# Patient Record
Sex: Male | Born: 1985 | Race: Black or African American | Hispanic: No | Marital: Single | Smoking: Never smoker
Health system: Southern US, Community
[De-identification: ages and names within clinical notes are randomized; demographics above are authoritative.]

---
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
## 2005-09-20 ENCOUNTER — Emergency Department (HOSPITAL_COMMUNITY): Admission: EM | Admit: 2005-09-20 | Discharge: 2005-09-20 | Payer: Self-pay | Admitting: Emergency Medicine

## 2005-11-12 ENCOUNTER — Encounter: Payer: Self-pay | Admitting: Emergency Medicine

## 2020-02-12 ENCOUNTER — Emergency Department (HOSPITAL_COMMUNITY): Payer: BLUE CROSS/BLUE SHIELD

## 2020-02-12 ENCOUNTER — Emergency Department (HOSPITAL_COMMUNITY): Payer: BLUE CROSS/BLUE SHIELD | Admitting: Anesthesiology

## 2020-02-12 ENCOUNTER — Encounter (HOSPITAL_COMMUNITY): Admission: EM | Disposition: A | Discharge status: Home Health | Payer: Self-pay | Source: Home / Self Care

## 2020-02-12 ENCOUNTER — Inpatient Hospital Stay (HOSPITAL_COMMUNITY)
Admission: EM | Admit: 2020-02-12 | Discharge: 2020-02-28 | DRG: 958 | Disposition: A | Discharge status: Home Health | Payer: BLUE CROSS/BLUE SHIELD | Attending: Physician Assistant | Admitting: Physician Assistant

## 2020-02-12 ENCOUNTER — Inpatient Hospital Stay (HOSPITAL_COMMUNITY): Payer: BLUE CROSS/BLUE SHIELD

## 2020-02-12 DIAGNOSIS — S31633A Puncture wound without foreign body of abdominal wall, right lower quadrant with penetration into peritoneal cavity, initial encounter: Secondary | ICD-10-CM | POA: Diagnosis present

## 2020-02-12 DIAGNOSIS — S31109A Unspecified open wound of abdominal wall, unspecified quadrant without penetration into peritoneal cavity, initial encounter: Secondary | ICD-10-CM

## 2020-02-12 DIAGNOSIS — Z6838 Body mass index (BMI) 38.0-38.9, adult: Secondary | ICD-10-CM

## 2020-02-12 DIAGNOSIS — Z20822 Contact with and (suspected) exposure to covid-19: Secondary | ICD-10-CM | POA: Diagnosis present

## 2020-02-12 DIAGNOSIS — S36598A Other injury of other part of colon, initial encounter: Secondary | ICD-10-CM | POA: Diagnosis present

## 2020-02-12 DIAGNOSIS — W3400XA Accidental discharge from unspecified firearms or gun, initial encounter: Secondary | ICD-10-CM

## 2020-02-12 DIAGNOSIS — S32301A Unspecified fracture of right ilium, initial encounter for closed fracture: Secondary | ICD-10-CM | POA: Diagnosis present

## 2020-02-12 DIAGNOSIS — N179 Acute kidney failure, unspecified: Secondary | ICD-10-CM | POA: Diagnosis not present

## 2020-02-12 DIAGNOSIS — K9189 Other postprocedural complications and disorders of digestive system: Secondary | ICD-10-CM

## 2020-02-12 DIAGNOSIS — T1490XA Injury, unspecified, initial encounter: Secondary | ICD-10-CM | POA: Diagnosis present

## 2020-02-12 DIAGNOSIS — K567 Ileus, unspecified: Secondary | ICD-10-CM | POA: Diagnosis not present

## 2020-02-12 DIAGNOSIS — S36030A Superficial (capsular) laceration of spleen, initial encounter: Secondary | ICD-10-CM | POA: Diagnosis present

## 2020-02-12 DIAGNOSIS — E669 Obesity, unspecified: Secondary | ICD-10-CM | POA: Diagnosis present

## 2020-02-12 DIAGNOSIS — Z4659 Encounter for fitting and adjustment of other gastrointestinal appliance and device: Secondary | ICD-10-CM

## 2020-02-12 HISTORY — PX: LAPAROTOMY: SHX154

## 2020-02-12 HISTORY — PX: OTHER SURGICAL HISTORY: SHX169

## 2020-02-12 LAB — TYPE AND SCREEN
ABO/RH(D): A POS
Antibody Screen: NEGATIVE
Unit division: 0
Unit division: 0
Unit division: 0
Unit division: 0
Unit division: 0
Unit division: 0
Unit division: 0
Unit division: 0
Unit division: 0
Unit division: 0
Unit division: 0
Unit division: 0
Unit division: 0
Unit division: 0
Unit division: 0
Unit division: 0

## 2020-02-12 LAB — COMPREHENSIVE METABOLIC PANEL
ALT: 32 U/L (ref 0–44)
AST: 43 U/L — ABNORMAL HIGH (ref 15–41)
Albumin: 3.8 g/dL (ref 3.5–5.0)
Alkaline Phosphatase: 50 U/L (ref 38–126)
Anion gap: 9 (ref 5–15)
BUN: 18 mg/dL (ref 6–20)
CO2: 21 mmol/L — ABNORMAL LOW (ref 22–32)
Calcium: 7.9 mg/dL — ABNORMAL LOW (ref 8.9–10.3)
Chloride: 108 mmol/L (ref 98–111)
Creatinine, Ser: 1.56 mg/dL — ABNORMAL HIGH (ref 0.61–1.24)
GFR calc Af Amer: >60 mL/min (ref >60)
GFR calc non Af Amer: 57 mL/min — ABNORMAL LOW (ref >60)
Glucose, Bld: 149 mg/dL — ABNORMAL HIGH (ref 70–99)
Potassium: 4.2 mmol/L (ref 3.5–5.1)
Sodium: 138 mmol/L (ref 135–145)
Total Bilirubin: 0.6 mg/dL (ref 0.3–1.2)
Total Protein: 6.2 g/dL — ABNORMAL LOW (ref 6.5–8.1)

## 2020-02-12 LAB — BPAM FFP
Blood Product Expiration Date: 202108042359
Blood Product Expiration Date: 202108052359
Blood Product Expiration Date: 202108052359
Blood Product Expiration Date: 202108052359
Blood Product Expiration Date: 202108052359
Blood Product Expiration Date: 202108062359
Blood Product Expiration Date: 202108062359
Blood Product Expiration Date: 202108062359
ISSUE DATE / TIME: 202108011545
ISSUE DATE / TIME: 202108011545
ISSUE DATE / TIME: 202108011545
ISSUE DATE / TIME: 202108011545
ISSUE DATE / TIME: 202108011549
ISSUE DATE / TIME: 202108011549
ISSUE DATE / TIME: 202108011551
ISSUE DATE / TIME: 202108011607
Unit Type and Rh: 6200
Unit Type and Rh: 6200
Unit Type and Rh: 6200
Unit Type and Rh: 6200
Unit Type and Rh: 6200
Unit Type and Rh: 6200
Unit Type and Rh: 6200
Unit Type and Rh: 6200

## 2020-02-12 LAB — BPAM RBC
Blood Product Expiration Date: 202108082359
Blood Product Expiration Date: 202108292359
Blood Product Expiration Date: 202108292359
Blood Product Expiration Date: 202108292359
Blood Product Expiration Date: 202108292359
Blood Product Expiration Date: 202108312359
Blood Product Expiration Date: 202108312359
Blood Product Expiration Date: 202108312359
Blood Product Expiration Date: 202108312359
Blood Product Expiration Date: 202108312359
Blood Product Expiration Date: 202108312359
Blood Product Expiration Date: 202108312359
Blood Product Expiration Date: 202108312359
Blood Product Expiration Date: 202108312359
Blood Product Expiration Date: 202108312359
Blood Product Expiration Date: 202108312359
ISSUE DATE / TIME: 202108011545
ISSUE DATE / TIME: 202108011545
ISSUE DATE / TIME: 202108011545
ISSUE DATE / TIME: 202108011545
ISSUE DATE / TIME: 202108011550
ISSUE DATE / TIME: 202108011550
ISSUE DATE / TIME: 202108011550
ISSUE DATE / TIME: 202108011550
ISSUE DATE / TIME: 202108011552
ISSUE DATE / TIME: 202108011552
ISSUE DATE / TIME: 202108011552
ISSUE DATE / TIME: 202108011552
ISSUE DATE / TIME: 202108011558
ISSUE DATE / TIME: 202108011558
ISSUE DATE / TIME: 202108011558
ISSUE DATE / TIME: 202108011558
Unit Type and Rh: 5100
Unit Type and Rh: 5100
Unit Type and Rh: 5100
Unit Type and Rh: 5100
Unit Type and Rh: 5100
Unit Type and Rh: 5100
Unit Type and Rh: 5100
Unit Type and Rh: 5100
Unit Type and Rh: 5100
Unit Type and Rh: 5100
Unit Type and Rh: 5100
Unit Type and Rh: 5100
Unit Type and Rh: 5100
Unit Type and Rh: 5100
Unit Type and Rh: 5100
Unit Type and Rh: 5100

## 2020-02-12 LAB — CBC
HCT: 42.9 % (ref 39.0–52.0)
Hemoglobin: 13.8 g/dL (ref 13.0–17.0)
MCH: 28 pg (ref 26.0–34.0)
MCHC: 32.2 g/dL (ref 30.0–36.0)
MCV: 87 fL (ref 80.0–100.0)
Platelets: 238 10*3/uL (ref 150–400)
RBC: 4.93 MIL/uL (ref 4.22–5.81)
RDW: 14.4 % (ref 11.5–15.5)
WBC: 9.3 10*3/uL (ref 4.0–10.5)
nRBC: 0 % (ref 0.0–0.2)

## 2020-02-12 LAB — PREPARE FRESH FROZEN PLASMA
Unit division: 0
Unit division: 0
Unit division: 0
Unit division: 0
Unit division: 0
Unit division: 0
Unit division: 0

## 2020-02-12 LAB — BPAM PLATELET PHERESIS
Blood Product Expiration Date: 202108032359
ISSUE DATE / TIME: 202108011554
Unit Type and Rh: 6200

## 2020-02-12 LAB — PREPARE PLATELET PHERESIS: Unit division: 0

## 2020-02-12 LAB — POCT I-STAT 7, (LYTES, BLD GAS, ICA,H+H)
Acid-base deficit: 3 mmol/L — ABNORMAL HIGH (ref 0.0–2.0)
Bicarbonate: 22.6 mmol/L (ref 20.0–28.0)
Calcium, Ion: 1.17 mmol/L (ref 1.15–1.40)
HCT: 39 % (ref 39.0–52.0)
Hemoglobin: 13.3 g/dL (ref 13.0–17.0)
O2 Saturation: 93 %
Potassium: 3.3 mmol/L — ABNORMAL LOW (ref 3.5–5.1)
Sodium: 143 mmol/L (ref 135–145)
TCO2: 24 mmol/L (ref 22–32)
pCO2 arterial: 42.8 mmHg (ref 32.0–48.0)
pH, Arterial: 7.331 — ABNORMAL LOW (ref 7.350–7.450)
pO2, Arterial: 72 mmHg — ABNORMAL LOW (ref 83.0–108.0)

## 2020-02-12 LAB — ABO/RH: ABO/RH(D): A POS

## 2020-02-12 LAB — TRAUMA TEG PANEL
CFF Max Amplitude: 17.2 mm (ref 15–32)
Citrated Kaolin (R): 4 min — ABNORMAL LOW (ref 4.6–9.1)
Citrated Rapid TEG (MA): 57.4 mm (ref 52–70)
Lysis at 30 Minutes: 0 % (ref 0.0–2.6)

## 2020-02-12 LAB — PROTIME-INR
INR: 1.1 (ref 0.8–1.2)
Prothrombin Time: 13.4 seconds (ref 11.4–15.2)

## 2020-02-12 LAB — ETHANOL: Alcohol, Ethyl (B): <10 mg/dL (ref <10)

## 2020-02-12 LAB — SARS CORONAVIRUS 2 BY RT PCR (HOSPITAL ORDER, PERFORMED IN ~~LOC~~ HOSPITAL LAB): SARS Coronavirus 2: NEGATIVE

## 2020-02-12 LAB — HIV ANTIBODY (ROUTINE TESTING W REFLEX): HIV Screen 4th Generation wRfx: NONREACTIVE

## 2020-02-12 LAB — LACTIC ACID, PLASMA: Lactic Acid, Venous: 2 mmol/L (ref 0.5–1.9)

## 2020-02-12 SURGERY — LAPAROTOMY, EXPLORATORY
Anesthesia: General | Site: Abdomen

## 2020-02-12 MED ORDER — MIDAZOLAM HCL 2 MG/2ML IJ SOLN
INTRAMUSCULAR | Status: AC
  Filled 2020-02-12: qty 2

## 2020-02-12 MED ORDER — METRONIDAZOLE IN NACL 5-0.79 MG/ML-% IV SOLN
500.0000 mg | INTRAVENOUS | Status: AC
  Administered 2020-02-12: 500 mg via INTRAVENOUS
  Filled 2020-02-12: qty 100

## 2020-02-12 MED ORDER — 0.9 % SODIUM CHLORIDE (POUR BTL) OPTIME
TOPICAL | Status: DC | PRN
  Administered 2020-02-12: 3000 mL
  Administered 2020-02-12: 1000 mL
  Administered 2020-02-12 (×3): 2000 mL

## 2020-02-12 MED ORDER — KETOROLAC TROMETHAMINE 30 MG/ML IJ SOLN
INTRAMUSCULAR | Status: AC
  Filled 2020-02-12: qty 1

## 2020-02-12 MED ORDER — SUCCINYLCHOLINE CHLORIDE 20 MG/ML IJ SOLN
INTRAMUSCULAR | Status: DC | PRN
Start: 2020-02-12 — End: 2020-02-12
  Administered 2020-02-12: 120 mg via INTRAVENOUS

## 2020-02-12 MED ORDER — DOCUSATE SODIUM 100 MG PO CAPS
100.0000 mg | ORAL_CAPSULE | Freq: Two times a day (BID) | ORAL | Status: DC
  Administered 2020-02-14 – 2020-02-28 (×18): 100 mg via ORAL
  Filled 2020-02-12 (×25): qty 1

## 2020-02-12 MED ORDER — OXYCODONE HCL 5 MG PO TABS
5.0000 mg | ORAL_TABLET | ORAL | Status: DC | PRN
  Administered 2020-02-13 – 2020-02-28 (×18): 10 mg via ORAL
  Filled 2020-02-12 (×20): qty 2

## 2020-02-12 MED ORDER — ROCURONIUM BROMIDE 10 MG/ML (PF) SYRINGE
PREFILLED_SYRINGE | INTRAVENOUS | Status: DC | PRN
  Administered 2020-02-12: 10 mg via INTRAVENOUS
  Administered 2020-02-12: 50 mg via INTRAVENOUS
  Administered 2020-02-12: 20 mg via INTRAVENOUS
  Administered 2020-02-12 (×2): 10 mg via INTRAVENOUS

## 2020-02-12 MED ORDER — ACETAMINOPHEN 10 MG/ML IV SOLN
INTRAVENOUS | Status: DC | PRN
  Administered 2020-02-12: 1000 mg via INTRAVENOUS

## 2020-02-12 MED ORDER — SODIUM CHLORIDE 0.9 % IV SOLN
INTRAVENOUS | Status: DC | PRN
Start: 2020-02-12 — End: 2020-02-12

## 2020-02-12 MED ORDER — PHENYLEPHRINE 40 MCG/ML (10ML) SYRINGE FOR IV PUSH (FOR BLOOD PRESSURE SUPPORT)
PREFILLED_SYRINGE | INTRAVENOUS | Status: AC
  Filled 2020-02-12: qty 10

## 2020-02-12 MED ORDER — ENOXAPARIN SODIUM 40 MG/0.4ML ~~LOC~~ SOLN
40.0000 mg | Freq: Two times a day (BID) | SUBCUTANEOUS | Status: DC
  Administered 2020-02-13 – 2020-02-27 (×29): 40 mg via SUBCUTANEOUS
  Filled 2020-02-12 (×35): qty 0.4

## 2020-02-12 MED ORDER — LACTATED RINGERS IV SOLN: INTRAVENOUS | Status: DC

## 2020-02-12 MED ORDER — PHENYLEPHRINE HCL-NACL 10-0.9 MG/250ML-% IV SOLN
INTRAVENOUS | Status: DC | PRN
  Administered 2020-02-12: 75 ug/min via INTRAVENOUS

## 2020-02-12 MED ORDER — ACETAMINOPHEN 500 MG PO TABS
1000.0000 mg | ORAL_TABLET | Freq: Four times a day (QID) | ORAL | Status: DC
  Administered 2020-02-13 – 2020-02-27 (×23): 1000 mg via ORAL
  Filled 2020-02-12 (×39): qty 2

## 2020-02-12 MED ORDER — FENTANYL CITRATE (PF) 250 MCG/5ML IJ SOLN
INTRAMUSCULAR | Status: AC
  Filled 2020-02-12: qty 5

## 2020-02-12 MED ORDER — ACETAMINOPHEN 10 MG/ML IV SOLN
INTRAVENOUS | Status: AC
  Filled 2020-02-12: qty 100

## 2020-02-12 MED ORDER — LIDOCAINE 2% (20 MG/ML) 5 ML SYRINGE
INTRAMUSCULAR | Status: DC | PRN
  Administered 2020-02-12: 60 mg via INTRAVENOUS

## 2020-02-12 MED ORDER — ONDANSETRON 4 MG PO TBDP: 4.0000 mg | ORAL_TABLET | Freq: Four times a day (QID) | ORAL | Status: DC | PRN

## 2020-02-12 MED ORDER — DEXAMETHASONE SODIUM PHOSPHATE 10 MG/ML IJ SOLN
INTRAMUSCULAR | Status: DC | PRN
  Administered 2020-02-12: 5 mg via INTRAVENOUS

## 2020-02-12 MED ORDER — SUGAMMADEX SODIUM 200 MG/2ML IV SOLN
INTRAVENOUS | Status: DC | PRN
  Administered 2020-02-12: 50 mg via INTRAVENOUS
  Administered 2020-02-12: 200 mg via INTRAVENOUS

## 2020-02-12 MED ORDER — CEFAZOLIN SODIUM-DEXTROSE 2-4 GM/100ML-% IV SOLN
2.0000 g | Freq: Once | INTRAVENOUS | Status: AC
  Administered 2020-02-12: 2 g via INTRAVENOUS

## 2020-02-12 MED ORDER — ONDANSETRON HCL 4 MG/2ML IJ SOLN
INTRAMUSCULAR | Status: DC | PRN
  Administered 2020-02-12: 4 mg via INTRAVENOUS

## 2020-02-12 MED ORDER — ALBUMIN HUMAN 5 % IV SOLN: INTRAVENOUS | Status: DC | PRN

## 2020-02-12 MED ORDER — PHENYLEPHRINE HCL (PRESSORS) 10 MG/ML IV SOLN
INTRAVENOUS | Status: DC | PRN
  Administered 2020-02-12: 120 ug via INTRAVENOUS

## 2020-02-12 MED ORDER — IOHEXOL 300 MG/ML  SOLN
125.0000 mL | Freq: Once | INTRAMUSCULAR | Status: AC | PRN
  Administered 2020-02-12: 125 mL via INTRAVENOUS

## 2020-02-12 MED ORDER — ONDANSETRON HCL 4 MG/2ML IJ SOLN
4.0000 mg | Freq: Four times a day (QID) | INTRAMUSCULAR | Status: DC | PRN
  Administered 2020-02-17 – 2020-02-22 (×14): 4 mg via INTRAVENOUS
  Filled 2020-02-12 (×15): qty 2

## 2020-02-12 MED ORDER — KETOROLAC TROMETHAMINE 30 MG/ML IJ SOLN
INTRAMUSCULAR | Status: DC | PRN
  Administered 2020-02-12: 30 mg via INTRAVENOUS

## 2020-02-12 MED ORDER — CIPROFLOXACIN IN D5W 400 MG/200ML IV SOLN
400.0000 mg | Freq: Two times a day (BID) | INTRAVENOUS | Status: AC
  Administered 2020-02-13: 400 mg via INTRAVENOUS
  Filled 2020-02-12 (×2): qty 200

## 2020-02-12 MED ORDER — PROPOFOL 10 MG/ML IV BOLUS
INTRAVENOUS | Status: DC | PRN
  Administered 2020-02-12: 100 mg via INTRAVENOUS

## 2020-02-12 MED ORDER — MORPHINE SULFATE (PF) 4 MG/ML IV SOLN
4.0000 mg | INTRAVENOUS | Status: DC | PRN
  Administered 2020-02-12 – 2020-02-13 (×4): 4 mg via INTRAVENOUS
  Filled 2020-02-12 (×5): qty 1

## 2020-02-12 MED ORDER — FENTANYL CITRATE (PF) 100 MCG/2ML IJ SOLN
INTRAMUSCULAR | Status: DC | PRN
  Administered 2020-02-12: 25 ug via INTRAVENOUS
  Administered 2020-02-12: 50 ug via INTRAVENOUS
  Administered 2020-02-12: 150 ug via INTRAVENOUS
  Administered 2020-02-12: 50 ug via INTRAVENOUS
  Administered 2020-02-12: 25 ug via INTRAVENOUS
  Administered 2020-02-12: 50 ug via INTRAVENOUS

## 2020-02-12 SURGICAL SUPPLY — 56 items
APL PRP STRL LF DISP 70% ISPRP (MISCELLANEOUS) ×1
BLADE CLIPPER SURG (BLADE) IMPLANT
BNDG GAUZE ELAST 4 BULKY (GAUZE/BANDAGES/DRESSINGS) ×3 IMPLANT
CANISTER SUCT 3000ML PPV (MISCELLANEOUS) ×3 IMPLANT
CANISTER WOUND CARE 500ML ATS (WOUND CARE) ×3 IMPLANT
CHLORAPREP W/TINT 26 (MISCELLANEOUS) ×3 IMPLANT
COVER SURGICAL LIGHT HANDLE (MISCELLANEOUS) ×3 IMPLANT
DRAIN CHANNEL 19F RND (DRAIN) ×3 IMPLANT
DRAPE LAPAROSCOPIC ABDOMINAL (DRAPES) ×3 IMPLANT
DRAPE UNIVERSAL (DRAPES) ×3 IMPLANT
DRAPE WARM FLUID 44X44 (DRAPES) ×3 IMPLANT
DRSG OPSITE POSTOP 4X10 (GAUZE/BANDAGES/DRESSINGS) IMPLANT
DRSG OPSITE POSTOP 4X8 (GAUZE/BANDAGES/DRESSINGS) IMPLANT
ELECT BLADE 4.0 EZ CLEAN MEGAD (MISCELLANEOUS) ×6
ELECT BLADE 6.5 EXT (BLADE) IMPLANT
ELECT CAUTERY BLADE 6.4 (BLADE) ×3 IMPLANT
ELECT REM PT RETURN 9FT ADLT (ELECTROSURGICAL) ×6
ELECTRODE BLDE 4.0 EZ CLN MEGD (MISCELLANEOUS) ×2 IMPLANT
ELECTRODE REM PT RTRN 9FT ADLT (ELECTROSURGICAL) ×2 IMPLANT
EVACUATOR SILICONE 100CC (DRAIN) ×3 IMPLANT
GAUZE SPONGE 4X4 12PLY STRL (GAUZE/BANDAGES/DRESSINGS) ×3 IMPLANT
GLOVE BIO SURGEON STRL SZ 6.5 (GLOVE) ×2 IMPLANT
GLOVE BIO SURGEONS STRL SZ 6.5 (GLOVE) ×1
GLOVE BIOGEL PI IND STRL 6 (GLOVE) ×1 IMPLANT
GLOVE BIOGEL PI INDICATOR 6 (GLOVE) ×2
GOWN STRL REUS W/ TWL LRG LVL3 (GOWN DISPOSABLE) ×2 IMPLANT
GOWN STRL REUS W/TWL LRG LVL3 (GOWN DISPOSABLE) ×6
HANDLE SUCTION POOLE (INSTRUMENTS) ×1 IMPLANT
HEMOSTAT NU-KNIT SURGICAL 3X4 (HEMOSTASIS) ×3 IMPLANT
KIT BASIN OR (CUSTOM PROCEDURE TRAY) ×3 IMPLANT
KIT TURNOVER KIT B (KITS) ×3 IMPLANT
LIGASURE IMPACT 36 18CM CVD LR (INSTRUMENTS) ×3 IMPLANT
NS IRRIG 1000ML POUR BTL (IV SOLUTION) ×6 IMPLANT
PACK GENERAL/GYN (CUSTOM PROCEDURE TRAY) ×3 IMPLANT
PAD ARMBOARD 7.5X6 YLW CONV (MISCELLANEOUS) ×3 IMPLANT
PENCIL SMOKE EVACUATOR (MISCELLANEOUS) ×3 IMPLANT
RELOAD PROXIMATE 75MM BLUE (ENDOMECHANICALS) ×6 IMPLANT
SPONGE LAP 18X18 RF (DISPOSABLE) ×6 IMPLANT
STAPLER GUN LINEAR PROX 60 (STAPLE) ×3 IMPLANT
STAPLER PROXIMATE 75MM BLUE (STAPLE) ×3 IMPLANT
STAPLER VISISTAT 35W (STAPLE) ×3 IMPLANT
SUCTION POOLE HANDLE (INSTRUMENTS) ×3
SUT ETHILON 2 0 FS 18 (SUTURE) ×3 IMPLANT
SUT PDS AB 0 CTX 60 (SUTURE) ×3 IMPLANT
SUT PDS AB 1 TP1 54 (SUTURE) IMPLANT
SUT PDS AB 1 TP1 96 (SUTURE) IMPLANT
SUT SILK 2 0 SH CR/8 (SUTURE) ×3 IMPLANT
SUT SILK 2 0 TIES 10X30 (SUTURE) ×6 IMPLANT
SUT SILK 3 0 SH CR/8 (SUTURE) ×15 IMPLANT
SUT SILK 3 0 TIES 10X30 (SUTURE) ×3 IMPLANT
SUT VIC AB 2-0 SH 18 (SUTURE) ×3 IMPLANT
SUT VIC AB 3-0 SH 18 (SUTURE) IMPLANT
TAPE CLOTH SURG 6X10 WHT LF (GAUZE/BANDAGES/DRESSINGS) ×3 IMPLANT
TOWEL GREEN STERILE (TOWEL DISPOSABLE) ×3 IMPLANT
TRAY FOLEY MTR SLVR 16FR STAT (SET/KITS/TRAYS/PACK) IMPLANT
YANKAUER SUCT BULB TIP NO VENT (SUCTIONS) IMPLANT

## 2020-02-12 NOTE — Anesthesia Procedure Notes (Signed)
Procedure Name: Intubation Date/Time: 02/12/2020 3:44 PM Performed by: Edmonia Caprio, CRNA Pre-anesthesia Checklist: Patient identified, Emergency Drugs available, Suction available, Patient being monitored and Timeout performed Patient Re-evaluated:Patient Re-evaluated prior to induction Oxygen Delivery Method: Circle system utilized Preoxygenation: Pre-oxygenation with 100% oxygen Induction Type: IV induction and Rapid sequence Laryngoscope Size: Glidescope and 3 Grade View: Grade II Tube type: Oral Tube size: 7.5 mm Number of attempts: 1 Airway Equipment and Method: Stylet and Video-laryngoscopy Placement Confirmation: ETT inserted through vocal cords under direct vision,  positive ETCO2 and breath sounds checked- equal and bilateral Secured at: 21 cm Tube secured with: Tape Dental Injury: Teeth and Oropharynx as per pre-operative assessment

## 2020-02-12 NOTE — ED Notes (Signed)
NS bolus started by A.Troxler, TRN
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
## 2020-02-12 NOTE — Anesthesia Postprocedure Evaluation (Signed)
Anesthesia Post Note  Patient: Todd Harrell  Procedure(s) Performed: EXPLORATORY LAPAROTOMY, SMALL BOWEL REPAIR ILEOCETECTOMY WITH PRIMARY REPAIR (N/A Abdomen)     Patient location during evaluation: PACU Anesthesia Type: General Level of consciousness: awake and alert Pain management: pain level controlled Vital Signs Assessment: post-procedure vital signs reviewed and stable Respiratory status: spontaneous breathing, nonlabored ventilation, respiratory function stable and patient connected to nasal cannula oxygen Cardiovascular status: blood pressure returned to baseline and stable Postop Assessment: no apparent nausea or vomiting Anesthetic complications: no   No complications documented.  Last Vitals:  Vitals:   02/12/20 1814 02/12/20 1829  BP: (!) 134/81 (!) 133/78  Pulse: 79 78  Resp: 17 21  Temp:  36.6 C  SpO2: 100% 100%    Last Pain:  Vitals:   02/12/20 1829  TempSrc:   PainSc: 0-No pain                 Tresia Revolorio P Hema Lanza

## 2020-02-12 NOTE — Plan of Care (Signed)

## 2020-02-12 NOTE — ED Notes (Signed)
Covid swab collected
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
## 2020-02-12 NOTE — ED Notes (Signed)
Pt's belongings placed in brown paper bag. 1 left gray tennis shoe, 1 right gray tennis shoe, 1 pair of black socks, 1 green t-shirt were placed in bag and left with Autumn - RN.
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
## 2020-02-12 NOTE — Transfer of Care (Signed)
Immediate Anesthesia Transfer of Care Note  Patient: Todd Harrell  Procedure(s) Performed: EXPLORATORY LAPAROTOMY, SMALL BOWEL REPAIR ILEOCETECTOMY WITH PRIMARY REPAIR (N/A Abdomen)  Patient Location: PACU  Anesthesia Type:General  Level of Consciousness: awake, alert  and oriented  Airway & Oxygen Therapy: Patient Spontanous Breathing and Patient connected to nasal cannula oxygen  Post-op Assessment: Report given to RN, Post -op Vital signs reviewed and stable and Patient moving all extremities X 4  Post vital signs: Reviewed and stable  Last Vitals:  Vitals Value Taken Time  BP 152/88 02/12/20 1759  Temp    Pulse 87 02/12/20 1800  Resp 18 02/12/20 1800  SpO2 100 % 02/12/20 1800  Vitals shown include unvalidated device data.  Last Pain:  Vitals:   02/12/20 1551  TempSrc:   PainSc: 3          Complications: No complications documented.
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
## 2020-02-12 NOTE — Anesthesia Procedure Notes (Signed)
Arterial Line Insertion Start/End8/07/2019 3:50 PM, 02/12/2020 4:00 PM Performed by: Leonides Grills, MD, anesthesiologist  Patient location: OR. Preanesthetic checklist: patient identified, IV checked, site marked, risks and benefits discussed, surgical consent, monitors and equipment checked, pre-op evaluation, timeout performed and anesthesia consent Patient sedated Left, radial was placed Catheter size: 20 Fr Hand hygiene performed , maximum sterile barriers used  and Seldinger technique used  Attempts: 2 (Previous attemts by CRNA) Procedure performed using ultrasound guided technique. Ultrasound Notes:anatomy identified, needle tip was noted to be adjacent to the nerve/plexus identified and no ultrasound evidence of intravascular and/or intraneural injection Following insertion, dressing applied and Biopatch. Post procedure assessment: normal and unchanged  Patient tolerated the procedure well with no immediate complications.
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
## 2020-02-13 ENCOUNTER — Other Ambulatory Visit: Payer: Self-pay

## 2020-02-13 ENCOUNTER — Encounter (HOSPITAL_COMMUNITY): Payer: Self-pay

## 2020-02-13 DIAGNOSIS — Z789 Other specified health status: Secondary | ICD-10-CM

## 2020-02-13 HISTORY — DX: Other specified health status: Z78.9

## 2020-02-13 LAB — CBC
HCT: 46.2 % (ref 39.0–52.0)
Hemoglobin: 14.8 g/dL (ref 13.0–17.0)
MCH: 28.3 pg (ref 26.0–34.0)
MCHC: 32 g/dL (ref 30.0–36.0)
MCV: 88.3 fL (ref 80.0–100.0)
Platelets: 220 10*3/uL (ref 150–400)
RBC: 5.23 MIL/uL (ref 4.22–5.81)
RDW: 14.7 % (ref 11.5–15.5)
WBC: 7.2 10*3/uL (ref 4.0–10.5)
nRBC: 0 % (ref 0.0–0.2)

## 2020-02-13 LAB — BPAM CRYOPRECIPITATE
Blood Product Expiration Date: 202108012000
Unit Type and Rh: 5100

## 2020-02-13 LAB — BASIC METABOLIC PANEL
Anion gap: 12 (ref 5–15)
BUN: 19 mg/dL (ref 6–20)
CO2: 20 mmol/L — ABNORMAL LOW (ref 22–32)
Calcium: 8 mg/dL — ABNORMAL LOW (ref 8.9–10.3)
Chloride: 105 mmol/L (ref 98–111)
Creatinine, Ser: 1.41 mg/dL — ABNORMAL HIGH (ref 0.61–1.24)
GFR calc Af Amer: >60 mL/min (ref >60)
GFR calc non Af Amer: >60 mL/min (ref >60)
Glucose, Bld: 185 mg/dL — ABNORMAL HIGH (ref 70–99)
Potassium: 5.1 mmol/L (ref 3.5–5.1)
Sodium: 137 mmol/L (ref 135–145)

## 2020-02-13 LAB — URINALYSIS, ROUTINE W REFLEX MICROSCOPIC
Bilirubin Urine: NEGATIVE
Glucose, UA: NEGATIVE mg/dL
Hgb urine dipstick: NEGATIVE
Ketones, ur: NEGATIVE mg/dL
Leukocytes,Ua: NEGATIVE
Nitrite: NEGATIVE
Protein, ur: NEGATIVE mg/dL
Specific Gravity, Urine: 1.043 — ABNORMAL HIGH (ref 1.005–1.030)
pH: 5 (ref 5.0–8.0)

## 2020-02-13 LAB — MASSIVE TRANSFUSION PROTOCOL ORDER (BLOOD BANK NOTIFICATION)

## 2020-02-13 LAB — PREPARE CRYOPRECIPITATE: Unit division: 0

## 2020-02-13 MED ORDER — MORPHINE SULFATE (PF) 2 MG/ML IV SOLN
1.0000 mg | INTRAVENOUS | Status: DC | PRN
  Administered 2020-02-13 (×3): 2 mg via INTRAVENOUS
  Administered 2020-02-14: 4 mg via INTRAVENOUS
  Administered 2020-02-14 – 2020-02-20 (×14): 2 mg via INTRAVENOUS
  Filled 2020-02-13 (×3): qty 1
  Filled 2020-02-13: qty 2
  Filled 2020-02-13 (×16): qty 1

## 2020-02-13 MED ORDER — CHLORHEXIDINE GLUCONATE CLOTH 2 % EX PADS
6.0000 | MEDICATED_PAD | Freq: Every day | CUTANEOUS | Status: DC
  Administered 2020-02-13 – 2020-02-28 (×15): 6 via TOPICAL

## 2020-02-13 MED ORDER — METHOCARBAMOL 1000 MG/10ML IJ SOLN
1000.0000 mg | Freq: Three times a day (TID) | INTRAVENOUS | Status: DC
  Administered 2020-02-13 – 2020-02-20 (×21): 1000 mg via INTRAVENOUS
  Filled 2020-02-13 (×16): qty 10
  Filled 2020-02-13: qty 1000
  Filled 2020-02-13 (×7): qty 10
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
## 2020-02-13 NOTE — Progress Notes (Signed)
Inpatient Rehab Admissions Coordinator Note:   Per therapy recommendations, pt was screened for CIR candidacy by Estill Dooms, PT, DPT.  At this time we are recommending a CIR consult and I will place an order per our protocol.  Please contact me with questions.   Estill Dooms, PT, DPT [phone] 02/13/20 1:55 PM
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
## 2020-02-14 LAB — BASIC METABOLIC PANEL
Anion gap: 9 (ref 5–15)
BUN: 18 mg/dL (ref 6–20)
CO2: 24 mmol/L (ref 22–32)
Calcium: 8.3 mg/dL — ABNORMAL LOW (ref 8.9–10.3)
Chloride: 104 mmol/L (ref 98–111)
Creatinine, Ser: 1.34 mg/dL — ABNORMAL HIGH (ref 0.61–1.24)
GFR calc Af Amer: >60 mL/min (ref >60)
GFR calc non Af Amer: >60 mL/min (ref >60)
Glucose, Bld: 160 mg/dL — ABNORMAL HIGH (ref 70–99)
Potassium: 4.7 mmol/L (ref 3.5–5.1)
Sodium: 137 mmol/L (ref 135–145)

## 2020-02-14 LAB — SURGICAL PATHOLOGY

## 2020-02-14 MED ORDER — GUAIFENESIN 100 MG/5ML PO SOLN: 5.0000 mL | Freq: Four times a day (QID) | ORAL | Status: DC | PRN

## 2020-02-14 MED ORDER — GUAIFENESIN 100 MG/5ML PO SOLN: 10.0000 mL | Freq: Four times a day (QID) | ORAL | Status: DC | PRN

## 2020-02-14 MED ORDER — GUAIFENESIN 100 MG/5ML PO SOLN
5.0000 mL | Freq: Four times a day (QID) | ORAL | Status: DC | PRN
  Administered 2020-02-14 (×2): 200 mg via ORAL
  Filled 2020-02-14: qty 50
  Filled 2020-02-14: qty 10

## 2020-02-14 NOTE — Plan of Care (Signed)
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
## 2020-02-14 NOTE — Progress Notes (Signed)
NG discontinued as ordered
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
## 2020-02-14 NOTE — Progress Notes (Signed)
Inpatient Rehab Admissions:  Inpatient Rehab Consult received.  I met with patient at the bedside for rehabilitation assessment and to discuss goals and expectations of an inpatient rehab admission.  Note pt's insurance carrier is out of network for [employer].  Discussed possible referral to in-network facility versus another level of care (per therapy recommendations), and pt would like to discuss with his significant other.  Ellan Lambert, RN CM, aware.  Will sign off for CIR at this time.  If pt chooses to pursue [facility] CIR will resume order.   Signed: Shann Medal, PT, DPT Admissions Coordinator [phone] 02/14/20  5:19 PM

## 2020-02-14 NOTE — Progress Notes (Signed)
Patient has tolerated up activity well.  Reports passing a small amount of flatus but burping a lot.  Encouraged not to drink a large amount of clear liquids.  2 small blistered areas noted on back.

## 2020-02-15 LAB — BASIC METABOLIC PANEL
Anion gap: 10 (ref 5–15)
BUN: 17 mg/dL (ref 6–20)
CO2: 23 mmol/L (ref 22–32)
Calcium: 8.6 mg/dL — ABNORMAL LOW (ref 8.9–10.3)
Chloride: 103 mmol/L (ref 98–111)
Creatinine, Ser: 1.19 mg/dL (ref 0.61–1.24)
GFR calc Af Amer: >60 mL/min (ref >60)
GFR calc non Af Amer: >60 mL/min (ref >60)
Glucose, Bld: 138 mg/dL — ABNORMAL HIGH (ref 70–99)
Potassium: 5.4 mmol/L — ABNORMAL HIGH (ref 3.5–5.1)
Sodium: 136 mmol/L (ref 135–145)

## 2020-02-15 MED ORDER — SODIUM CHLORIDE 0.9 % IV SOLN: INTRAVENOUS | Status: AC

## 2020-02-15 NOTE — Plan of Care (Signed)
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
## 2020-02-15 NOTE — Progress Notes (Signed)
Patient encouraged to be up more/out of bed.  The patient ambulated to the BR and tolerated up activity well.  Abd remains distended. Denies nausea at this time.  The patient continues to burp a lot and reports small amount of flatus

## 2020-02-15 NOTE — Progress Notes (Signed)
Patient had 2 episodes of emesis during 7p-7a. Noted thin, watery blood tinged. Placed at room in bin. Patient noted with increased pulse 120-140's when up ambulating to BR and also preceding emesis event.   Patient stated " I got relief after I vomited" . Patient refused antiemetic both times. After ambulating and vomiting episodes , pulse returned to 80-90's.  Following continued per MD orders.

## 2020-02-15 NOTE — Progress Notes (Addendum)
   02/14/20 2002  Vitals  Temp 99.7 F (37.6 C)  Temp Source Oral  BP (!) 139/92  MAP (mmHg) 106  BP Method Automatic  Pulse Rate (!) 116 (pt up to the BR)  Resp 18  MEWS COLOR  MEWS Score Color Yellow  Oxygen Therapy  SpO2 93 %  O2 Device Room Air  Pain Assessment  Pain Scale 0-10  Pain Score 6  Pain Type Surgical pain  Pain Location Abdomen  Pain Descriptors / Indicators Aching;Discomfort  Pain Frequency Intermittent  Pain Onset With Activity  Patients Stated Pain Goal 2  Pain Intervention(s) Medication (See eMAR)  Multiple Pain Sites No  Critical Care Pain Observation Tool (CPOT)  Facial Expression 0  Body Movements 0  Muscle Tension 0  Complaints & Interventions  Nausea relieved by Nothing  Patients response to intervention Unchanged  MEWS Score  MEWS Temp 0  MEWS Systolic 0  MEWS Pulse 2  MEWS RR 0  MEWS LOC 0  MEWS Score 2  MEWS caused by pulse. Noted when patient was ambulating to BR. Also noted again when patient with 1 episode of emesis.

## 2020-02-15 NOTE — Progress Notes (Signed)
PT Cancellation Note  Patient Details Name: Todd Harrell MRN: [id number] DOB: 1986-05-21   Cancelled Treatment:    Reason Eval/Treat Not Completed: (P) Other (comment) (Pt sleeping soundly woke briefly and requested to rest a little longer, will f/u per POC.)   Mintie Witherington J Aundria Rud 02/15/2020, 2:55 PM  Bonney Leitz , PTA Acute Rehabilitation Services Pager [phone] Office [phone]

## 2020-02-16 ENCOUNTER — Inpatient Hospital Stay (HOSPITAL_COMMUNITY): Payer: BLUE CROSS/BLUE SHIELD

## 2020-02-16 LAB — BASIC METABOLIC PANEL
Anion gap: 10 (ref 5–15)
BUN: 18 mg/dL (ref 6–20)
CO2: 27 mmol/L (ref 22–32)
Calcium: 8.7 mg/dL — ABNORMAL LOW (ref 8.9–10.3)
Chloride: 101 mmol/L (ref 98–111)
Creatinine, Ser: 1.32 mg/dL — ABNORMAL HIGH (ref 0.61–1.24)
GFR calc Af Amer: >60 mL/min (ref >60)
GFR calc non Af Amer: >60 mL/min (ref >60)
Glucose, Bld: 143 mg/dL — ABNORMAL HIGH (ref 70–99)
Potassium: 4.2 mmol/L (ref 3.5–5.1)
Sodium: 138 mmol/L (ref 135–145)

## 2020-02-16 MED ORDER — PHENOL 1.4 % MT LIQD
1.0000 | OROMUCOSAL | Status: DC | PRN
  Administered 2020-02-16: 1 via OROMUCOSAL
  Filled 2020-02-16: qty 177

## 2020-02-16 MED ORDER — MENTHOL 3 MG MT LOZG
1.0000 | LOZENGE | OROMUCOSAL | Status: DC | PRN
  Filled 2020-02-16: qty 9
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
## 2020-02-16 NOTE — Progress Notes (Addendum)
Pt with 1 episode of emesis this morning approx 400 ml of green colored liquid.   Abdominal dressing completed per MD order. Pt remains NPO with sips of water with meds and 1/2 cup of  ice chips.

## 2020-02-16 NOTE — Plan of Care (Signed)
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
## 2020-02-17 ENCOUNTER — Encounter (HOSPITAL_COMMUNITY): Payer: Self-pay

## 2020-02-17 LAB — BASIC METABOLIC PANEL
Anion gap: 11 (ref 5–15)
BUN: 20 mg/dL (ref 6–20)
CO2: 28 mmol/L (ref 22–32)
Calcium: 9.1 mg/dL (ref 8.9–10.3)
Chloride: 104 mmol/L (ref 98–111)
Creatinine, Ser: 1.33 mg/dL — ABNORMAL HIGH (ref 0.61–1.24)
GFR calc Af Amer: >60 mL/min (ref >60)
GFR calc non Af Amer: >60 mL/min (ref >60)
Glucose, Bld: 138 mg/dL — ABNORMAL HIGH (ref 70–99)
Potassium: 4.3 mmol/L (ref 3.5–5.1)
Sodium: 143 mmol/L (ref 135–145)
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
## 2020-02-17 NOTE — Progress Notes (Signed)
Notified Barnetta Chapel, PA-C, about pt's complaints of a small amount of blood in stool this morning. Pt states his stool had an appearance coffee grounds. Pt also requesting to have water, PA-C strongly advised against. Pt is to have a few ice chips throughout the shift only. No new orders at this time.
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
## 2020-02-17 NOTE — Plan of Care (Signed)
  Problem: Pain Managment: Goal: General experience of comfort will improve Outcome: Progressing   
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
## 2020-02-18 MED ORDER — PROMETHAZINE HCL 25 MG/ML IJ SOLN
12.5000 mg | Freq: Four times a day (QID) | INTRAMUSCULAR | Status: DC | PRN
  Administered 2020-02-18 – 2020-02-19 (×3): 12.5 mg via INTRAVENOUS
  Filled 2020-02-18 (×3): qty 1

## 2020-02-18 NOTE — Plan of Care (Signed)
  Problem: Activity: Goal: Risk for activity intolerance will decrease Outcome: Progressing   Problem: Coping: Goal: Level of anxiety will decrease Outcome: Progressing   Problem: Elimination: Goal: Will not experience complications related to bowel motility Outcome: Progressing   Problem: Pain Managment: Goal: General experience of comfort will improve Outcome: Progressing   Problem: Safety: Goal: Ability to remain free from injury will improve Outcome: Progressing   

## 2020-02-18 NOTE — Plan of Care (Signed)
  Problem: Safety: Goal: Ability to remain free from injury will improve Outcome: Progressing   
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
## 2020-02-19 ENCOUNTER — Inpatient Hospital Stay (HOSPITAL_COMMUNITY): Payer: BLUE CROSS/BLUE SHIELD

## 2020-02-19 LAB — BASIC METABOLIC PANEL
Anion gap: 11 (ref 5–15)
BUN: 20 mg/dL (ref 6–20)
CO2: 27 mmol/L (ref 22–32)
Calcium: 9.1 mg/dL (ref 8.9–10.3)
Chloride: 104 mmol/L (ref 98–111)
Creatinine, Ser: 1.38 mg/dL — ABNORMAL HIGH (ref 0.61–1.24)
GFR calc Af Amer: >60 mL/min (ref >60)
GFR calc non Af Amer: >60 mL/min (ref >60)
Glucose, Bld: 128 mg/dL — ABNORMAL HIGH (ref 70–99)
Potassium: 5 mmol/L (ref 3.5–5.1)
Sodium: 142 mmol/L (ref 135–145)

## 2020-02-19 LAB — CBC
HCT: 32.8 % — ABNORMAL LOW (ref 39.0–52.0)
Hemoglobin: 10.5 g/dL — ABNORMAL LOW (ref 13.0–17.0)
MCH: 28.2 pg (ref 26.0–34.0)
MCHC: 32 g/dL (ref 30.0–36.0)
MCV: 88.2 fL (ref 80.0–100.0)
Platelets: 316 10*3/uL (ref 150–400)
RBC: 3.72 MIL/uL — ABNORMAL LOW (ref 4.22–5.81)
RDW: 14.9 % (ref 11.5–15.5)
WBC: 15.9 10*3/uL — ABNORMAL HIGH (ref 4.0–10.5)
nRBC: 0.1 % (ref 0.0–0.2)

## 2020-02-19 LAB — MAGNESIUM: Magnesium: 2 mg/dL (ref 1.7–2.4)

## 2020-02-19 MED ORDER — IOHEXOL 9 MG/ML PO SOLN
500.0000 mL | ORAL | Status: AC
  Administered 2020-02-19 (×2): 500 mL via ORAL

## 2020-02-19 MED ORDER — IOHEXOL 300 MG/ML  SOLN
125.0000 mL | Freq: Once | INTRAMUSCULAR | Status: AC | PRN
  Administered 2020-02-19: 125 mL via INTRAVENOUS
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
## 2020-02-20 ENCOUNTER — Inpatient Hospital Stay: Payer: Self-pay

## 2020-02-20 LAB — BASIC METABOLIC PANEL
Anion gap: 11 (ref 5–15)
BUN: 18 mg/dL (ref 6–20)
CO2: 28 mmol/L (ref 22–32)
Calcium: 8.7 mg/dL — ABNORMAL LOW (ref 8.9–10.3)
Chloride: 103 mmol/L (ref 98–111)
Creatinine, Ser: 1.47 mg/dL — ABNORMAL HIGH (ref 0.61–1.24)
GFR calc Af Amer: >60 mL/min (ref >60)
GFR calc non Af Amer: >60 mL/min (ref >60)
Glucose, Bld: 128 mg/dL — ABNORMAL HIGH (ref 70–99)
Potassium: 4.3 mmol/L (ref 3.5–5.1)
Sodium: 142 mmol/L (ref 135–145)

## 2020-02-20 LAB — CBC
HCT: 32.6 % — ABNORMAL LOW (ref 39.0–52.0)
Hemoglobin: 10.5 g/dL — ABNORMAL LOW (ref 13.0–17.0)
MCH: 28.1 pg (ref 26.0–34.0)
MCHC: 32.2 g/dL (ref 30.0–36.0)
MCV: 87.2 fL (ref 80.0–100.0)
Platelets: 361 10*3/uL (ref 150–400)
RBC: 3.74 MIL/uL — ABNORMAL LOW (ref 4.22–5.81)
RDW: 14.7 % (ref 11.5–15.5)
WBC: 16.8 10*3/uL — ABNORMAL HIGH (ref 4.0–10.5)
nRBC: 0.1 % (ref 0.0–0.2)

## 2020-02-20 LAB — PREALBUMIN: Prealbumin: 15.2 mg/dL — ABNORMAL LOW (ref 18–38)

## 2020-02-20 MED ORDER — METHOCARBAMOL 500 MG PO TABS
1000.0000 mg | ORAL_TABLET | Freq: Three times a day (TID) | ORAL | Status: DC
  Administered 2020-02-20 – 2020-02-28 (×22): 1000 mg via ORAL
  Filled 2020-02-20 (×24): qty 2

## 2020-02-20 MED ORDER — MORPHINE SULFATE (PF) 2 MG/ML IV SOLN
1.0000 mg | INTRAVENOUS | Status: DC | PRN
  Administered 2020-02-21: 4 mg via INTRAVENOUS
  Administered 2020-02-21: 2 mg via INTRAVENOUS
  Administered 2020-02-21: 4 mg via INTRAVENOUS
  Administered 2020-02-21 (×2): 2 mg via INTRAVENOUS
  Administered 2020-02-22 – 2020-02-25 (×3): 4 mg via INTRAVENOUS
  Administered 2020-02-26 – 2020-02-27 (×3): 2 mg via INTRAVENOUS
  Filled 2020-02-20: qty 1
  Filled 2020-02-20 (×3): qty 2
  Filled 2020-02-20: qty 1
  Filled 2020-02-20: qty 2
  Filled 2020-02-20 (×5): qty 1
  Filled 2020-02-20: qty 2
  Filled 2020-02-20: qty 1
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
## 2020-02-20 NOTE — Progress Notes (Signed)
PT Cancellation Note  Patient Details Name: Todd Harrell MRN: [id number] DOB: 1986/01/08   Cancelled Treatment:    Reason Eval/Treat Not Completed: Patient declined, no reason specified;Pain limiting ability to participate Attempted to see in AM and PM today; pt declined in AM stating his mouth was dry and didn't want to walk with dry mouth and in PM due to pain after getting out of the bathroom. Will follow.   [name] Evelena Masci 02/20/2020, 1:32 PM [place], PT, DPT Acute Rehabilitation Services Pager [phone] Office [phone]
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
## 2020-02-20 NOTE — Plan of Care (Signed)

## 2020-02-21 ENCOUNTER — Inpatient Hospital Stay (HOSPITAL_COMMUNITY): Payer: BLUE CROSS/BLUE SHIELD

## 2020-02-21 LAB — HEMOGLOBIN A1C
Hgb A1c MFr Bld: 6.5 % — ABNORMAL HIGH (ref 4.8–5.6)
Mean Plasma Glucose: 139.85 mg/dL

## 2020-02-21 LAB — COMPREHENSIVE METABOLIC PANEL
ALT: 104 U/L — ABNORMAL HIGH (ref 0–44)
AST: 81 U/L — ABNORMAL HIGH (ref 15–41)
Albumin: 2.4 g/dL — ABNORMAL LOW (ref 3.5–5.0)
Alkaline Phosphatase: 265 U/L — ABNORMAL HIGH (ref 38–126)
Anion gap: 11 (ref 5–15)
BUN: 17 mg/dL (ref 6–20)
CO2: 25 mmol/L (ref 22–32)
Calcium: 8.5 mg/dL — ABNORMAL LOW (ref 8.9–10.3)
Chloride: 104 mmol/L (ref 98–111)
Creatinine, Ser: 1.32 mg/dL — ABNORMAL HIGH (ref 0.61–1.24)
GFR calc Af Amer: >60 mL/min (ref >60)
GFR calc non Af Amer: >60 mL/min (ref >60)
Glucose, Bld: 122 mg/dL — ABNORMAL HIGH (ref 70–99)
Potassium: 4.4 mmol/L (ref 3.5–5.1)
Sodium: 140 mmol/L (ref 135–145)
Total Bilirubin: 1 mg/dL (ref 0.3–1.2)
Total Protein: 6.2 g/dL — ABNORMAL LOW (ref 6.5–8.1)

## 2020-02-21 LAB — CBC
HCT: 29.6 % — ABNORMAL LOW (ref 39.0–52.0)
Hemoglobin: 9.8 g/dL — ABNORMAL LOW (ref 13.0–17.0)
MCH: 28.7 pg (ref 26.0–34.0)
MCHC: 33.1 g/dL (ref 30.0–36.0)
MCV: 86.5 fL (ref 80.0–100.0)
Platelets: 399 10*3/uL (ref 150–400)
RBC: 3.42 MIL/uL — ABNORMAL LOW (ref 4.22–5.81)
RDW: 14.6 % (ref 11.5–15.5)
WBC: 18.7 10*3/uL — ABNORMAL HIGH (ref 4.0–10.5)
nRBC: 0 % (ref 0.0–0.2)

## 2020-02-21 LAB — TRIGLYCERIDES: Triglycerides: 67 mg/dL (ref <150)

## 2020-02-21 LAB — GLUCOSE, CAPILLARY
Glucose-Capillary: 107 mg/dL — ABNORMAL HIGH (ref 70–99)
Glucose-Capillary: 152 mg/dL — ABNORMAL HIGH (ref 70–99)

## 2020-02-21 LAB — DIFFERENTIAL
Abs Immature Granulocytes: 0.35 10*3/uL — ABNORMAL HIGH (ref 0.00–0.07)
Basophils Absolute: 0.1 10*3/uL (ref 0.0–0.1)
Basophils Relative: 0 %
Eosinophils Absolute: 0.1 10*3/uL (ref 0.0–0.5)
Eosinophils Relative: 1 %
Immature Granulocytes: 2 %
Lymphocytes Relative: 12 %
Lymphs Abs: 2.3 10*3/uL (ref 0.7–4.0)
Monocytes Absolute: 0.9 10*3/uL (ref 0.1–1.0)
Monocytes Relative: 5 %
Neutro Abs: 15.1 10*3/uL — ABNORMAL HIGH (ref 1.7–7.7)
Neutrophils Relative %: 80 %

## 2020-02-21 LAB — PHOSPHORUS: Phosphorus: 4.3 mg/dL (ref 2.5–4.6)

## 2020-02-21 LAB — MAGNESIUM: Magnesium: 2 mg/dL (ref 1.7–2.4)

## 2020-02-21 MED ORDER — INSULIN ASPART 100 UNIT/ML ~~LOC~~ SOLN
0.0000 [IU] | Freq: Four times a day (QID) | SUBCUTANEOUS | Status: DC
  Administered 2020-02-22 (×2): 2 [IU] via SUBCUTANEOUS

## 2020-02-21 MED ORDER — SODIUM CHLORIDE 0.9% FLUSH
10.0000 mL | INTRAVENOUS | Status: DC | PRN
  Administered 2020-02-24 – 2020-02-28 (×2): 10 mL

## 2020-02-21 MED ORDER — NALOXONE HCL 0.4 MG/ML IJ SOLN
INTRAMUSCULAR | Status: AC
  Filled 2020-02-21: qty 1

## 2020-02-21 MED ORDER — SODIUM CHLORIDE 0.9% FLUSH
10.0000 mL | Freq: Two times a day (BID) | INTRAVENOUS | Status: DC
  Administered 2020-02-21: 20 mL
  Administered 2020-02-22 – 2020-02-27 (×3): 10 mL

## 2020-02-21 MED ORDER — TRAVASOL 10 % IV SOLN
INTRAVENOUS | Status: AC
  Filled 2020-02-21: qty 576
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
## 2020-02-21 NOTE — Progress Notes (Signed)
Peripherally Inserted Central Catheter Placement  The IV Nurse has discussed with the patient and/or persons authorized to consent for the patient, the purpose of this procedure and the potential benefits and risks involved with this procedure.  The benefits include less needle sticks, lab draws from the catheter, and the patient may be discharged home with the catheter. Risks include, but not limited to, infection, bleeding, blood clot (thrombus formation), and puncture of an artery; nerve damage and irregular heartbeat and possibility to perform a PICC exchange if needed/ordered by physician.  Alternatives to this procedure were also discussed.  Bard Power PICC patient education guide, fact sheet on infection prevention and patient information card has been provided to patient /or left at bedside.    PICC Placement Documentation  PICC Double Lumen 02/21/20 PICC Right Brachial 42 cm 1 cm (Active)  Indication for Insertion or Continuance of Line Administration of hyperosmolar/irritating solutions (i.e. TPN, Vancomycin, etc.) 02/21/20 1100  Exposed Catheter (cm) 1 cm 02/21/20 1100  Site Assessment Clean;Dry;Intact 02/21/20 1100  Lumen #1 Status Flushed;Saline locked;Blood return noted 02/21/20 1100  Lumen #2 Status Flushed;Blood return noted;Saline locked 02/21/20 1100  Dressing Type Transparent;Securing device 02/21/20 1100  Dressing Status Clean;Dry;Intact;Antimicrobial disc in place 02/21/20 1100  Dressing Change Due 02/28/20 02/21/20 1100       Franne Grip Renee 02/21/2020, 11:03 AM
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
## 2020-02-22 LAB — BASIC METABOLIC PANEL
Anion gap: 11 (ref 5–15)
BUN: 16 mg/dL (ref 6–20)
CO2: 28 mmol/L (ref 22–32)
Calcium: 8.8 mg/dL — ABNORMAL LOW (ref 8.9–10.3)
Chloride: 98 mmol/L (ref 98–111)
Creatinine, Ser: 1.29 mg/dL — ABNORMAL HIGH (ref 0.61–1.24)
GFR calc Af Amer: >60 mL/min (ref >60)
GFR calc non Af Amer: >60 mL/min (ref >60)
Glucose, Bld: 154 mg/dL — ABNORMAL HIGH (ref 70–99)
Potassium: 4.3 mmol/L (ref 3.5–5.1)
Sodium: 137 mmol/L (ref 135–145)

## 2020-02-22 LAB — CBC
HCT: 31.3 % — ABNORMAL LOW (ref 39.0–52.0)
Hemoglobin: 9.4 g/dL — ABNORMAL LOW (ref 13.0–17.0)
MCH: 27.3 pg (ref 26.0–34.0)
MCHC: 30 g/dL (ref 30.0–36.0)
MCV: 91 fL (ref 80.0–100.0)
Platelets: 414 10*3/uL — ABNORMAL HIGH (ref 150–400)
RBC: 3.44 MIL/uL — ABNORMAL LOW (ref 4.22–5.81)
RDW: 15 % (ref 11.5–15.5)
WBC: 18.8 10*3/uL — ABNORMAL HIGH (ref 4.0–10.5)
nRBC: 0.1 % (ref 0.0–0.2)

## 2020-02-22 LAB — GLUCOSE, CAPILLARY
Glucose-Capillary: 127 mg/dL — ABNORMAL HIGH (ref 70–99)
Glucose-Capillary: 143 mg/dL — ABNORMAL HIGH (ref 70–99)
Glucose-Capillary: 151 mg/dL — ABNORMAL HIGH (ref 70–99)
Glucose-Capillary: 152 mg/dL — ABNORMAL HIGH (ref 70–99)
Glucose-Capillary: 163 mg/dL — ABNORMAL HIGH (ref 70–99)
Glucose-Capillary: 167 mg/dL — ABNORMAL HIGH (ref 70–99)

## 2020-02-22 LAB — MAGNESIUM: Magnesium: 2.2 mg/dL (ref 1.7–2.4)

## 2020-02-22 LAB — PHOSPHORUS: Phosphorus: 4.2 mg/dL (ref 2.5–4.6)

## 2020-02-22 MED ORDER — TRAVASOL 10 % IV SOLN
INTRAVENOUS | Status: AC
  Filled 2020-02-22: qty 1008

## 2020-02-22 MED ORDER — SODIUM CHLORIDE 0.9 % IV SOLN
12.5000 mg | Freq: Four times a day (QID) | INTRAVENOUS | Status: DC | PRN
  Administered 2020-02-22: 12.5 mg via INTRAVENOUS
  Filled 2020-02-22 (×4): qty 0.5

## 2020-02-22 MED ORDER — INSULIN ASPART 100 UNIT/ML ~~LOC~~ SOLN
0.0000 [IU] | SUBCUTANEOUS | Status: DC
  Administered 2020-02-22: 3 [IU] via SUBCUTANEOUS
  Administered 2020-02-22: 2 [IU] via SUBCUTANEOUS
  Administered 2020-02-22 (×2): 3 [IU] via SUBCUTANEOUS
  Administered 2020-02-22 – 2020-02-23 (×5): 2 [IU] via SUBCUTANEOUS
  Administered 2020-02-24 (×2): 3 [IU] via SUBCUTANEOUS
  Administered 2020-02-24: 2 [IU] via SUBCUTANEOUS
  Administered 2020-02-24 – 2020-02-25 (×3): 3 [IU] via SUBCUTANEOUS
  Administered 2020-02-25: 2 [IU] via SUBCUTANEOUS
  Administered 2020-02-25: 3 [IU] via SUBCUTANEOUS
  Administered 2020-02-25: 2 [IU] via SUBCUTANEOUS
  Administered 2020-02-26: 5 [IU] via SUBCUTANEOUS
  Administered 2020-02-26: 8 [IU] via SUBCUTANEOUS
  Administered 2020-02-26: 5 [IU] via SUBCUTANEOUS
  Administered 2020-02-26: 8 [IU] via SUBCUTANEOUS
  Administered 2020-02-26 – 2020-02-27 (×3): 5 [IU] via SUBCUTANEOUS

## 2020-02-22 MED ORDER — SODIUM CHLORIDE 0.9 % IV SOLN: INTRAVENOUS | Status: AC
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
## 2020-02-22 NOTE — Progress Notes (Signed)
PT Cancellation Note  Patient Details Name: Todd Harrell MRN: [id number] DOB: Jun 24, 1986   Cancelled Treatment:    Reason Eval/Treat Not Completed: Patient declined, no reason specified (fatigue, sleepy due to pain medicine). PT attempted to see patient twice today, initially pt requesting PT return later in the day around 2PM as pt was tired from recently receiving pain medicine. PT returned at 1348 and pt was sound asleep, more lethargic and groggy than previous attempt, again declining PT intervention as he had recently received pain medicine and is "out of it". PT will attempt to return as time allows.   Arlyss Gandy 02/22/2020, 1:53 PM

## 2020-02-22 NOTE — Plan of Care (Signed)
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
## 2020-02-23 LAB — GLUCOSE, CAPILLARY
Glucose-Capillary: 146 mg/dL — ABNORMAL HIGH (ref 70–99)
Glucose-Capillary: 133 mg/dL — ABNORMAL HIGH (ref 70–99)
Glucose-Capillary: 139 mg/dL — ABNORMAL HIGH (ref 70–99)
Glucose-Capillary: 141 mg/dL — ABNORMAL HIGH (ref 70–99)
Glucose-Capillary: 151 mg/dL — ABNORMAL HIGH (ref 70–99)

## 2020-02-23 LAB — COMPREHENSIVE METABOLIC PANEL
ALT: 145 U/L — ABNORMAL HIGH (ref 0–44)
AST: 103 U/L — ABNORMAL HIGH (ref 15–41)
Albumin: 2.3 g/dL — ABNORMAL LOW (ref 3.5–5.0)
Alkaline Phosphatase: 258 U/L — ABNORMAL HIGH (ref 38–126)
Anion gap: 9 (ref 5–15)
BUN: 15 mg/dL (ref 6–20)
CO2: 27 mmol/L (ref 22–32)
Calcium: 8.5 mg/dL — ABNORMAL LOW (ref 8.9–10.3)
Chloride: 99 mmol/L (ref 98–111)
Creatinine, Ser: 1.21 mg/dL (ref 0.61–1.24)
GFR calc Af Amer: >60 mL/min (ref >60)
GFR calc non Af Amer: >60 mL/min (ref >60)
Glucose, Bld: 156 mg/dL — ABNORMAL HIGH (ref 70–99)
Potassium: 4.6 mmol/L (ref 3.5–5.1)
Sodium: 135 mmol/L (ref 135–145)
Total Bilirubin: 0.9 mg/dL (ref 0.3–1.2)
Total Protein: 6.8 g/dL (ref 6.5–8.1)

## 2020-02-23 LAB — PHOSPHORUS: Phosphorus: 4 mg/dL (ref 2.5–4.6)

## 2020-02-23 LAB — CBC
HCT: 29.5 % — ABNORMAL LOW (ref 39.0–52.0)
Hemoglobin: 9.5 g/dL — ABNORMAL LOW (ref 13.0–17.0)
MCH: 28.4 pg (ref 26.0–34.0)
MCHC: 32.2 g/dL (ref 30.0–36.0)
MCV: 88.3 fL (ref 80.0–100.0)
Platelets: 425 10*3/uL — ABNORMAL HIGH (ref 150–400)
RBC: 3.34 MIL/uL — ABNORMAL LOW (ref 4.22–5.81)
RDW: 14.7 % (ref 11.5–15.5)
WBC: 17.5 10*3/uL — ABNORMAL HIGH (ref 4.0–10.5)
nRBC: 0 % (ref 0.0–0.2)

## 2020-02-23 LAB — MAGNESIUM: Magnesium: 1.8 mg/dL (ref 1.7–2.4)

## 2020-02-23 MED ORDER — TRAVASOL 10 % IV SOLN
INTRAVENOUS | Status: AC
  Filled 2020-02-23: qty 1440

## 2020-02-23 MED ORDER — MAGNESIUM SULFATE IN D5W 1-5 GM/100ML-% IV SOLN
1.0000 g | Freq: Once | INTRAVENOUS | Status: AC
  Administered 2020-02-23: 1 g via INTRAVENOUS
  Filled 2020-02-23: qty 100

## 2020-02-23 MED ORDER — SODIUM CHLORIDE 0.9 % IV SOLN: INTRAVENOUS | Status: DC

## 2020-02-23 MED ORDER — ENSURE ENLIVE PO LIQD
237.0000 mL | Freq: Two times a day (BID) | ORAL | Status: DC
  Administered 2020-02-25 – 2020-02-28 (×6): 237 mL via ORAL

## 2020-02-23 MED ORDER — PROMETHAZINE HCL 25 MG/ML IJ SOLN: 25.0000 mg | Freq: Four times a day (QID) | INTRAMUSCULAR | Status: DC | PRN

## 2020-02-23 NOTE — Plan of Care (Signed)
  Problem: Education: Goal: Knowledge of General Education information will improve Description: Including pain rating scale, medication(s)/side effects and non-pharmacologic comfort measures Outcome: Progressing   Problem: Health Behavior/Discharge Planning: Goal: Ability to manage health-related needs will improve Outcome: Progressing   Problem: Activity: Goal: Risk for activity intolerance will decrease Outcome: Progressing   Problem: Nutrition: Goal: Adequate nutrition will be maintained Outcome: Progressing   Problem: Coping: Goal: Level of anxiety will decrease Outcome: Progressing   Problem: Elimination: Goal: Will not experience complications related to bowel motility Outcome: Progressing   Problem: Safety: Goal: Ability to remain free from injury will improve Outcome: Progressing   Problem: Skin Integrity: Goal: Risk for impaired skin integrity will decrease Outcome: Progressing   
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
## 2020-02-23 NOTE — Plan of Care (Signed)
  Problem: Education: Goal: Knowledge of General Education information will improve Description: Including pain rating scale, medication(s)/side effects and non-pharmacologic comfort measures Outcome: Progressing   Problem: Activity: Goal: Risk for activity intolerance will decrease Outcome: Progressing   Problem: Nutrition: Goal: Adequate nutrition will be maintained Outcome: Progressing   
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
## 2020-02-24 ENCOUNTER — Inpatient Hospital Stay (HOSPITAL_COMMUNITY): Payer: BLUE CROSS/BLUE SHIELD

## 2020-02-24 LAB — BASIC METABOLIC PANEL
Anion gap: 9 (ref 5–15)
BUN: 14 mg/dL (ref 6–20)
CO2: 26 mmol/L (ref 22–32)
Calcium: 8.7 mg/dL — ABNORMAL LOW (ref 8.9–10.3)
Chloride: 97 mmol/L — ABNORMAL LOW (ref 98–111)
Creatinine, Ser: 1.07 mg/dL (ref 0.61–1.24)
GFR calc Af Amer: >60 mL/min (ref >60)
GFR calc non Af Amer: >60 mL/min (ref >60)
Glucose, Bld: 184 mg/dL — ABNORMAL HIGH (ref 70–99)
Potassium: 4.1 mmol/L (ref 3.5–5.1)
Sodium: 132 mmol/L — ABNORMAL LOW (ref 135–145)

## 2020-02-24 LAB — CBC
HCT: 30.1 % — ABNORMAL LOW (ref 39.0–52.0)
Hemoglobin: 9.7 g/dL — ABNORMAL LOW (ref 13.0–17.0)
MCH: 27.9 pg (ref 26.0–34.0)
MCHC: 32.2 g/dL (ref 30.0–36.0)
MCV: 86.5 fL (ref 80.0–100.0)
Platelets: 434 10*3/uL — ABNORMAL HIGH (ref 150–400)
RBC: 3.48 MIL/uL — ABNORMAL LOW (ref 4.22–5.81)
RDW: 14.6 % (ref 11.5–15.5)
WBC: 15.5 10*3/uL — ABNORMAL HIGH (ref 4.0–10.5)
nRBC: 0 % (ref 0.0–0.2)

## 2020-02-24 LAB — GLUCOSE, CAPILLARY
Glucose-Capillary: 143 mg/dL — ABNORMAL HIGH (ref 70–99)
Glucose-Capillary: 148 mg/dL — ABNORMAL HIGH (ref 70–99)
Glucose-Capillary: 158 mg/dL — ABNORMAL HIGH (ref 70–99)
Glucose-Capillary: 162 mg/dL — ABNORMAL HIGH (ref 70–99)
Glucose-Capillary: 173 mg/dL — ABNORMAL HIGH (ref 70–99)
Glucose-Capillary: 177 mg/dL — ABNORMAL HIGH (ref 70–99)
Glucose-Capillary: 191 mg/dL — ABNORMAL HIGH (ref 70–99)

## 2020-02-24 LAB — PHOSPHORUS: Phosphorus: 3.4 mg/dL (ref 2.5–4.6)

## 2020-02-24 LAB — MAGNESIUM: Magnesium: 2.1 mg/dL (ref 1.7–2.4)

## 2020-02-24 MED ORDER — SODIUM CHLORIDE 1 G PO TABS
1.0000 g | ORAL_TABLET | Freq: Three times a day (TID) | ORAL | Status: AC
  Administered 2020-02-25 (×3): 1 g via ORAL
  Filled 2020-02-24 (×4): qty 1

## 2020-02-24 MED ORDER — TRAVASOL 10 % IV SOLN
INTRAVENOUS | Status: AC
  Filled 2020-02-24: qty 1440

## 2020-02-24 MED ORDER — IOHEXOL 300 MG/ML  SOLN
100.0000 mL | Freq: Once | INTRAMUSCULAR | Status: AC | PRN
  Administered 2020-02-24: 100 mL via INTRAVENOUS

## 2020-02-24 MED ORDER — IOHEXOL 9 MG/ML PO SOLN
500.0000 mL | ORAL | Status: AC
  Administered 2020-02-24 (×2): 500 mL via ORAL
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
## 2020-02-24 NOTE — Plan of Care (Signed)
  Problem: Education: Goal: Knowledge of General Education information will improve Description: Including pain rating scale, medication(s)/side effects and non-pharmacologic comfort measures Outcome: Progressing   Problem: Elimination: Goal: Will not experience complications related to bowel motility Outcome: Progressing   Problem: Pain Managment: Goal: General experience of comfort will improve Outcome: Progressing   
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
## 2020-02-25 LAB — GLUCOSE, CAPILLARY
Glucose-Capillary: 145 mg/dL — ABNORMAL HIGH (ref 70–99)
Glucose-Capillary: 149 mg/dL — ABNORMAL HIGH (ref 70–99)
Glucose-Capillary: 164 mg/dL — ABNORMAL HIGH (ref 70–99)
Glucose-Capillary: 182 mg/dL — ABNORMAL HIGH (ref 70–99)
Glucose-Capillary: 196 mg/dL — ABNORMAL HIGH (ref 70–99)
Glucose-Capillary: 221 mg/dL — ABNORMAL HIGH (ref 70–99)

## 2020-02-25 LAB — CBC
HCT: 28 % — ABNORMAL LOW (ref 39.0–52.0)
Hemoglobin: 8.9 g/dL — ABNORMAL LOW (ref 13.0–17.0)
MCH: 27.4 pg (ref 26.0–34.0)
MCHC: 31.8 g/dL (ref 30.0–36.0)
MCV: 86.2 fL (ref 80.0–100.0)
Platelets: 446 10*3/uL — ABNORMAL HIGH (ref 150–400)
RBC: 3.25 MIL/uL — ABNORMAL LOW (ref 4.22–5.81)
RDW: 14.6 % (ref 11.5–15.5)
WBC: 12.5 10*3/uL — ABNORMAL HIGH (ref 4.0–10.5)
nRBC: 0.2 % (ref 0.0–0.2)

## 2020-02-25 LAB — BASIC METABOLIC PANEL
Anion gap: 9 (ref 5–15)
BUN: 14 mg/dL (ref 6–20)
CO2: 27 mmol/L (ref 22–32)
Calcium: 8.7 mg/dL — ABNORMAL LOW (ref 8.9–10.3)
Chloride: 98 mmol/L (ref 98–111)
Creatinine, Ser: 1.17 mg/dL (ref 0.61–1.24)
GFR calc Af Amer: >60 mL/min (ref >60)
GFR calc non Af Amer: >60 mL/min (ref >60)
Glucose, Bld: 211 mg/dL — ABNORMAL HIGH (ref 70–99)
Potassium: 4.5 mmol/L (ref 3.5–5.1)
Sodium: 134 mmol/L — ABNORMAL LOW (ref 135–145)

## 2020-02-25 MED ORDER — TRAVASOL 10 % IV SOLN
INTRAVENOUS | Status: AC
  Filled 2020-02-25: qty 1440
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
## 2020-02-25 NOTE — Plan of Care (Signed)
  Problem: Education: Goal: Knowledge of General Education information will improve Description: Including pain rating scale, medication(s)/side effects and non-pharmacologic comfort measures Outcome: Progressing   Problem: Health Behavior/Discharge Planning: Goal: Ability to manage health-related needs will improve Outcome: Progressing   Problem: Clinical Measurements: Goal: Ability to maintain clinical measurements within normal limits will improve Outcome: Progressing Goal: Will remain free from infection Outcome: Progressing   Problem: Clinical Measurements: Goal: Will remain free from infection Outcome: Progressing   Problem: Activity: Goal: Risk for activity intolerance will decrease Outcome: Progressing
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
## 2020-02-26 ENCOUNTER — Encounter (HOSPITAL_COMMUNITY): Payer: Self-pay

## 2020-02-26 LAB — GLUCOSE, CAPILLARY
Glucose-Capillary: 210 mg/dL — ABNORMAL HIGH (ref 70–99)
Glucose-Capillary: 241 mg/dL — ABNORMAL HIGH (ref 70–99)
Glucose-Capillary: 281 mg/dL — ABNORMAL HIGH (ref 70–99)
Glucose-Capillary: 55 mg/dL — ABNORMAL LOW (ref 70–99)
Glucose-Capillary: 75 mg/dL (ref 70–99)
Glucose-Capillary: 251 mg/dL — ABNORMAL HIGH (ref 70–99)

## 2020-02-26 LAB — COMPREHENSIVE METABOLIC PANEL
ALT: 200 U/L — ABNORMAL HIGH (ref 0–44)
AST: 110 U/L — ABNORMAL HIGH (ref 15–41)
Albumin: 2.1 g/dL — ABNORMAL LOW (ref 3.5–5.0)
Alkaline Phosphatase: 253 U/L — ABNORMAL HIGH (ref 38–126)
Anion gap: 8 (ref 5–15)
BUN: 15 mg/dL (ref 6–20)
CO2: 28 mmol/L (ref 22–32)
Calcium: 8.6 mg/dL — ABNORMAL LOW (ref 8.9–10.3)
Chloride: 99 mmol/L (ref 98–111)
Creatinine, Ser: 1.09 mg/dL (ref 0.61–1.24)
GFR calc Af Amer: >60 mL/min (ref >60)
GFR calc non Af Amer: >60 mL/min (ref >60)
Glucose, Bld: 216 mg/dL — ABNORMAL HIGH (ref 70–99)
Potassium: 4.1 mmol/L (ref 3.5–5.1)
Sodium: 135 mmol/L (ref 135–145)
Total Bilirubin: 0.2 mg/dL — ABNORMAL LOW (ref 0.3–1.2)
Total Protein: 6.8 g/dL (ref 6.5–8.1)

## 2020-02-26 LAB — PHOSPHORUS: Phosphorus: 3.8 mg/dL (ref 2.5–4.6)

## 2020-02-26 LAB — MAGNESIUM: Magnesium: 1.8 mg/dL (ref 1.7–2.4)

## 2020-02-26 MED ORDER — TRAVASOL 10 % IV SOLN
INTRAVENOUS | Status: AC
  Filled 2020-02-26: qty 1440

## 2020-02-26 MED ORDER — DEXTROSE 50 % IV SOLN
INTRAVENOUS | Status: AC
  Administered 2020-02-26: 25 mL
  Filled 2020-02-26: qty 50
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
## 2020-02-26 NOTE — Progress Notes (Signed)
Pt blood sugar dropped to 55 after being taken off TPN. 25mg  dextrose given, blood sugar up to 75. TPN restarted at 109 ml/hr.Will increase TPN to 218 in 1 hr.

## 2020-02-27 LAB — COMPREHENSIVE METABOLIC PANEL
ALT: 201 U/L — ABNORMAL HIGH (ref 0–44)
AST: 84 U/L — ABNORMAL HIGH (ref 15–41)
Albumin: 2.1 g/dL — ABNORMAL LOW (ref 3.5–5.0)
Alkaline Phosphatase: 256 U/L — ABNORMAL HIGH (ref 38–126)
Anion gap: 8 (ref 5–15)
BUN: 16 mg/dL (ref 6–20)
CO2: 27 mmol/L (ref 22–32)
Calcium: 8.6 mg/dL — ABNORMAL LOW (ref 8.9–10.3)
Chloride: 98 mmol/L (ref 98–111)
Creatinine, Ser: 1.08 mg/dL (ref 0.61–1.24)
GFR calc Af Amer: >60 mL/min (ref >60)
GFR calc non Af Amer: >60 mL/min (ref >60)
Glucose, Bld: 238 mg/dL — ABNORMAL HIGH (ref 70–99)
Potassium: 4.4 mmol/L (ref 3.5–5.1)
Sodium: 133 mmol/L — ABNORMAL LOW (ref 135–145)
Total Bilirubin: <0.1 mg/dL — ABNORMAL LOW (ref 0.3–1.2)
Total Protein: 7.2 g/dL (ref 6.5–8.1)

## 2020-02-27 LAB — CBC
HCT: 28.2 % — ABNORMAL LOW (ref 39.0–52.0)
Hemoglobin: 9.1 g/dL — ABNORMAL LOW (ref 13.0–17.0)
MCH: 28.5 pg (ref 26.0–34.0)
MCHC: 32.3 g/dL (ref 30.0–36.0)
MCV: 88.4 fL (ref 80.0–100.0)
Platelets: 482 10*3/uL — ABNORMAL HIGH (ref 150–400)
RBC: 3.19 MIL/uL — ABNORMAL LOW (ref 4.22–5.81)
RDW: 14.6 % (ref 11.5–15.5)
WBC: 10.2 10*3/uL (ref 4.0–10.5)
nRBC: 0.3 % — ABNORMAL HIGH (ref 0.0–0.2)

## 2020-02-27 LAB — GLUCOSE, CAPILLARY
Glucose-Capillary: 108 mg/dL — ABNORMAL HIGH (ref 70–99)
Glucose-Capillary: 126 mg/dL — ABNORMAL HIGH (ref 70–99)
Glucose-Capillary: 147 mg/dL — ABNORMAL HIGH (ref 70–99)
Glucose-Capillary: 178 mg/dL — ABNORMAL HIGH (ref 70–99)
Glucose-Capillary: 229 mg/dL — ABNORMAL HIGH (ref 70–99)
Glucose-Capillary: 246 mg/dL — ABNORMAL HIGH (ref 70–99)

## 2020-02-27 LAB — DIFFERENTIAL
Abs Immature Granulocytes: 0.49 10*3/uL — ABNORMAL HIGH (ref 0.00–0.07)
Basophils Absolute: 0.1 10*3/uL (ref 0.0–0.1)
Basophils Relative: 1 %
Eosinophils Absolute: 0.2 10*3/uL (ref 0.0–0.5)
Eosinophils Relative: 2 %
Immature Granulocytes: 5 %
Lymphocytes Relative: 19 %
Lymphs Abs: 2 10*3/uL (ref 0.7–4.0)
Monocytes Absolute: 1.1 10*3/uL — ABNORMAL HIGH (ref 0.1–1.0)
Monocytes Relative: 11 %
Neutro Abs: 6.7 10*3/uL (ref 1.7–7.7)
Neutrophils Relative %: 62 %

## 2020-02-27 LAB — PREALBUMIN: Prealbumin: 16.9 mg/dL — ABNORMAL LOW (ref 18–38)

## 2020-02-27 LAB — MAGNESIUM: Magnesium: 2.1 mg/dL (ref 1.7–2.4)

## 2020-02-27 LAB — PHOSPHORUS: Phosphorus: 4.3 mg/dL (ref 2.5–4.6)

## 2020-02-27 LAB — TRIGLYCERIDES: Triglycerides: 128 mg/dL (ref <150)

## 2020-02-27 MED ORDER — TRACE MINERALS CU-MN-SE-ZN 300-55-60-3000 MCG/ML IV SOLN
INTRAVENOUS | Status: DC
  Filled 2020-02-27: qty 480

## 2020-02-27 MED ORDER — DAKINS (1/4 STRENGTH) 0.125 % EX SOLN
Freq: Three times a day (TID) | CUTANEOUS | Status: DC
  Filled 2020-02-27: qty 473

## 2020-02-27 MED ORDER — INSULIN ASPART 100 UNIT/ML ~~LOC~~ SOLN
0.0000 [IU] | SUBCUTANEOUS | Status: DC
  Administered 2020-02-27 – 2020-02-28 (×2): 1 [IU] via SUBCUTANEOUS

## 2020-02-27 MED ORDER — INSULIN ASPART 100 UNIT/ML ~~LOC~~ SOLN
0.0000 [IU] | SUBCUTANEOUS | Status: DC
  Administered 2020-02-27: 2 [IU] via SUBCUTANEOUS
  Administered 2020-02-28 (×2): 3 [IU] via SUBCUTANEOUS

## 2020-02-27 MED ORDER — TRACE MINERALS CU-MN-SE-ZN 300-55-60-3000 MCG/ML IV SOLN
INTRAVENOUS | Status: AC
  Filled 2020-02-27: qty 480
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
## 2020-02-27 NOTE — Progress Notes (Signed)
Inpatient Diabetes Program Recommendations  AACE/ADA: New Consensus Statement on Inpatient Glycemic Control (2015)  Target Ranges:  Prepandial:   less than 140 mg/dL      Peak postprandial:   less than 180 mg/dL (1-2 hours)      Critically ill patients:  140 - 180 mg/dL   Lab Results  Component Value Date   GLUCAP 178 (H) 02/27/2020   HGBA1C 6.5 (H) 02/21/2020    Inpatient Diabetes Program Recommendations:    Hypoglycemia of 55mg /dl on .  Likely due to insulin stacking; insulin given 2 hrs apart.  Will continue to follow while inpatient.  Thank you, 9/44, RN, BSN Diabetes Coordinator Inpatient Diabetes Program [phone] (team pager from 8a-5p)

## 2020-02-27 NOTE — Plan of Care (Signed)
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
## 2020-02-28 LAB — GLUCOSE, CAPILLARY
Glucose-Capillary: 120 mg/dL — ABNORMAL HIGH (ref 70–99)
Glucose-Capillary: 123 mg/dL — ABNORMAL HIGH (ref 70–99)
Glucose-Capillary: 154 mg/dL — ABNORMAL HIGH (ref 70–99)
Glucose-Capillary: 193 mg/dL — ABNORMAL HIGH (ref 70–99)

## 2020-02-28 LAB — BASIC METABOLIC PANEL
Anion gap: 12 (ref 5–15)
BUN: 16 mg/dL (ref 6–20)
CO2: 27 mmol/L (ref 22–32)
Calcium: 9.2 mg/dL (ref 8.9–10.3)
Chloride: 94 mmol/L — ABNORMAL LOW (ref 98–111)
Creatinine, Ser: 1.06 mg/dL (ref 0.61–1.24)
GFR calc Af Amer: >60 mL/min (ref >60)
GFR calc non Af Amer: >60 mL/min (ref >60)
Glucose, Bld: 194 mg/dL — ABNORMAL HIGH (ref 70–99)
Potassium: 4.3 mmol/L (ref 3.5–5.1)
Sodium: 133 mmol/L — ABNORMAL LOW (ref 135–145)

## 2020-02-28 MED ORDER — DOCUSATE SODIUM 100 MG PO CAPS: 100.0000 mg | ORAL_CAPSULE | Freq: Every day | ORAL | Status: AC | PRN

## 2020-02-28 MED ORDER — METHOCARBAMOL 500 MG PO TABS: 1000.0000 mg | ORAL_TABLET | Freq: Three times a day (TID) | ORAL | 0 refills | Status: AC | PRN

## 2020-02-28 MED ORDER — DAKINS (1/4 STRENGTH) 0.125 % EX SOLN: Freq: Three times a day (TID) | CUTANEOUS | 0 refills | Status: AC

## 2020-02-28 MED ORDER — OXYCODONE HCL 5 MG PO TABS: 5.0000 mg | ORAL_TABLET | Freq: Four times a day (QID) | ORAL | 0 refills | Status: AC | PRN

## 2020-02-28 MED ORDER — ACETAMINOPHEN 500 MG PO TABS: 1000.0000 mg | ORAL_TABLET | Freq: Four times a day (QID) | ORAL | Status: AC | PRN

## 2020-02-28 MED FILL — METHOCARBAMOL 500 MG TABS: 500 | 12 days supply | Qty: 60 | Fill #0

## 2020-02-28 MED FILL — oxyCODONE HCL 5 MG TABS: 5 | 6 days supply | Qty: 40 | Fill #0

## 2020-02-28 NOTE — Discharge Instructions (Signed)
[facility], [place] [phone]  OPEN ABDOMINAL SURGERY: POST OP INSTRUCTIONS  Always review your discharge instruction sheet given to you by the facility where your surgery was performed.  IF YOU HAVE DISABILITY OR FAMILY LEAVE FORMS, YOU MUST BRING THEM TO THE OFFICE FOR PROCESSING.  PLEASE DO NOT GIVE THEM TO YOUR DOCTOR.  1. A prescription for pain medication may be given to you upon discharge.  Take your pain medication as prescribed, if needed.  If narcotic pain medicine is not needed, then you may take acetaminophen (Tylenol) or ibuprofen (Advil) as needed. 2. Take your usually prescribed medications unless otherwise directed. 3. If you need a refill on your pain medication, please contact your pharmacy. They will contact our office to request authorization.  Prescriptions will not be filled after 5pm or on week-ends. 4. You should follow a light diet the first few days after arrival home, such as soup and crackers, pudding, etc.unless your doctor has advised otherwise. A high-fiber, low fat diet can be resumed as tolerated.   Be sure to include lots of fluids daily. Most patients will experience some swelling and bruising on the chest and neck area.  Ice packs will help.  Swelling and bruising can take several days to resolve 5. Most patients will experience some swelling and bruising in the area of the incision. Ice pack will help. Swelling and bruising can take several days to resolve..  6. It is common to experience some constipation if taking pain medication after surgery.  Increasing fluid intake and taking a stool softener will usually help or prevent this problem from occurring.  A mild laxative (Milk of Magnesia or Miralax) should be taken according to package directions if there are no bowel movements after 48 hours. 7.  You may have steri-strips (small skin tapes) in place directly over the incision.  These strips should be left on the skin for 7-10 days.  If your  surgeon used skin glue on the incision, you may shower in 24 hours.  The glue will flake off over the next 2-3 weeks.  Any sutures or staples will be removed at the office during your follow-up visit. You may find that a light gauze bandage over your incision may keep your staples from being rubbed or pulled. You may shower and replace the bandage daily. 8. ACTIVITIES:  You may resume regular (light) daily activities beginning the next day--such as daily self-care, walking, climbing stairs--gradually increasing activities as tolerated.  You may have sexual intercourse when it is comfortable.  Refrain from any heavy lifting or straining until approved by your doctor. a. You may drive when you no longer are taking prescription pain medication, you can comfortably wear a seatbelt, and you can safely maneuver your car and apply brakes  9. You should see your doctor in the office for a follow-up appointment approximately two weeks after your surgery.  Make sure that you call for this appointment within a day or two after you arrive home to insure a convenient appointment time.   WHEN TO CALL YOUR DOCTOR: 1. Fever over 101.0 2. Inability to urinate 3. Nausea and/or vomiting 4. Extreme swelling or bruising 5. Continued bleeding from incision. 6. Increased pain, redness, or drainage from the incision. 7. Difficulty swallowing or breathing 8. Muscle cramping or spasms. 9. Numbness or tingling in hands or feet or around lips.  The clinic staff is available to answer your questions during regular business hours.  Please dont hesitate to call and ask to speak to one of the nurses if you have concerns.  For further questions, please visit [email or web address]  MIDLINE WOUND CARE: USE DAKIN's Through Thurs 8/19 and then switch back to normal saline for moist part of dressing - midline dressing to be changed three times daily - supplies: sterile saline, gauze, scissors, ABD pads, tape  - remove  dressing and all packing carefully, moistening with sterile saline as needed to avoid packing/internal dressing sticking to the wound. - clean edges of skin around the wound with water/gauze, making sure there is no tape debris or leakage left on skin that could cause skin irritation or breakdown. - dampen clean gauze with sterile saline and pack wound from wound base to skin level, making sure to take note of any possible areas of wound tracking, tunneling and packing appropriately. Wound can be packed loosely.  - Make sure you loosely pack small edge of gauze into inferior tract - cover wound with a dry ABD pad and secure with tape.  - write the date/time on the dry dressing/tape to better track when the last dressing change occurred. - apply any skin protectant/powder recommended by clinician to protect skin/skin folds. - change dressing as needed if leakage occurs, wound gets contaminated, or patient requests to shower. - patient may shower daily with wound open and following the shower the wound should be dried and a clean dressing placed.

## 2020-02-28 NOTE — Progress Notes (Signed)
Pt PICC removed by IV team, pt belongings gathered and pt dressed. Pt AVS reviewed and all questions answered to satisfaction. Pt and family educated on dressing changes and given supplies until follow up appointment. All questions answered. Pt taken downstairs to family car.
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
## 2020-02-28 NOTE — Plan of Care (Signed)
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
## 2021-11-30 IMAGING — DX DG ABD PORTABLE 1V
1 series · 6 of 6 positions shown · non-contrast
Comparison: February 16, 2020.

CLINICAL DATA: Postoperative ileus

EXAM:
PORTABLE ABDOMEN - 1 VIEW

[Series 1: abdomen · 0.14mm/px · 6 of 6 slices shown]
[im 1/6]
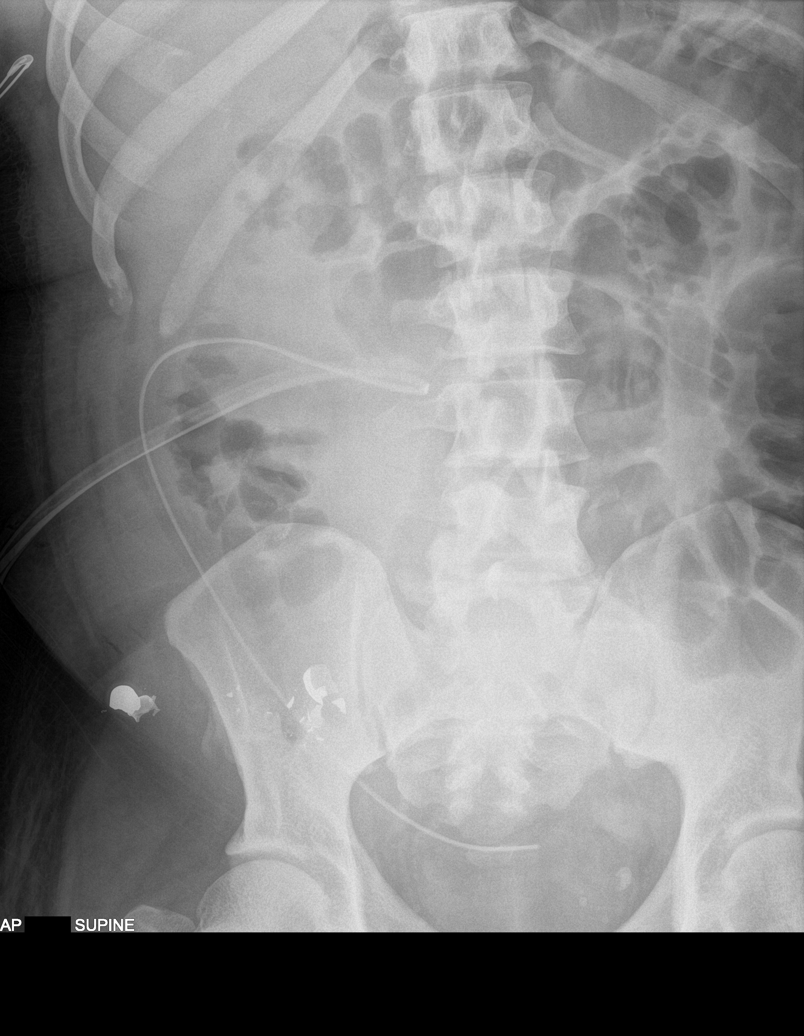
[im 2/6]
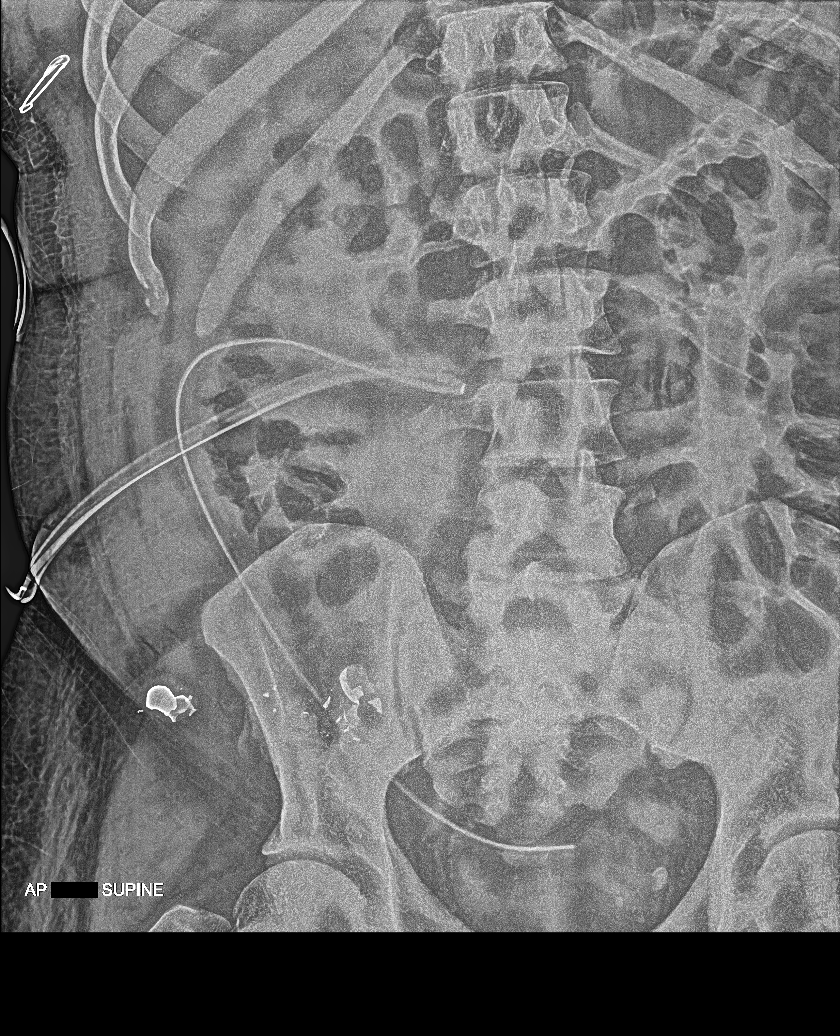
[im 3/6]
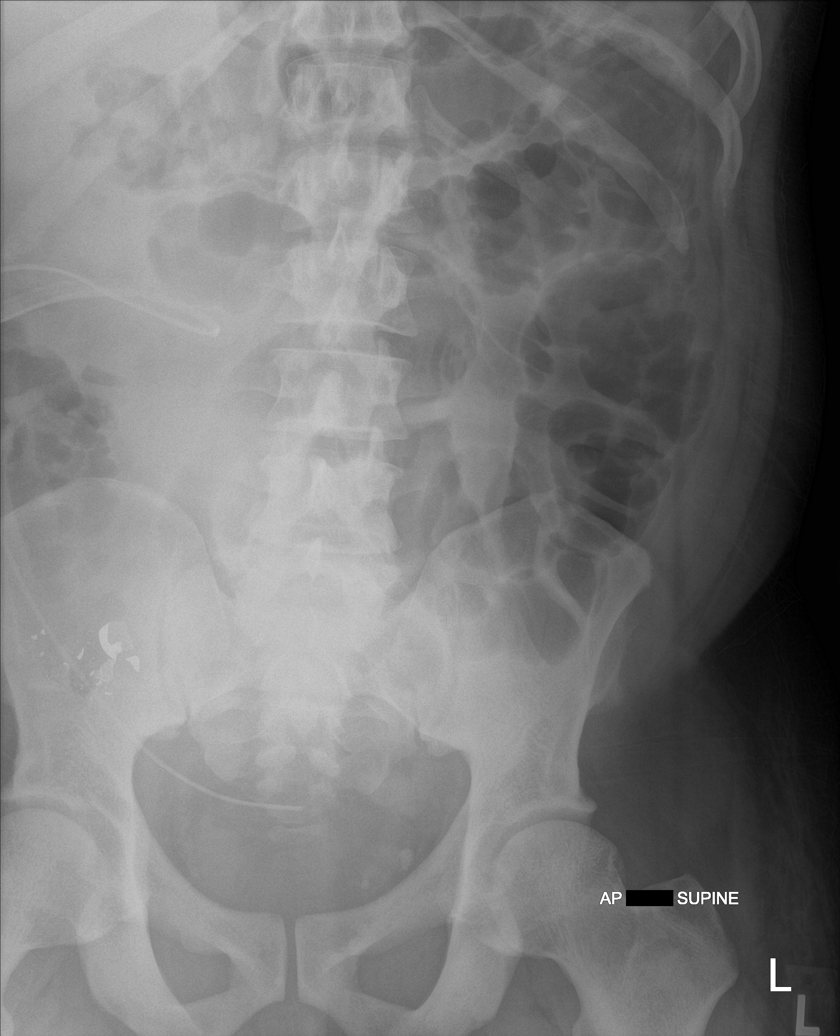
[im 4/6]
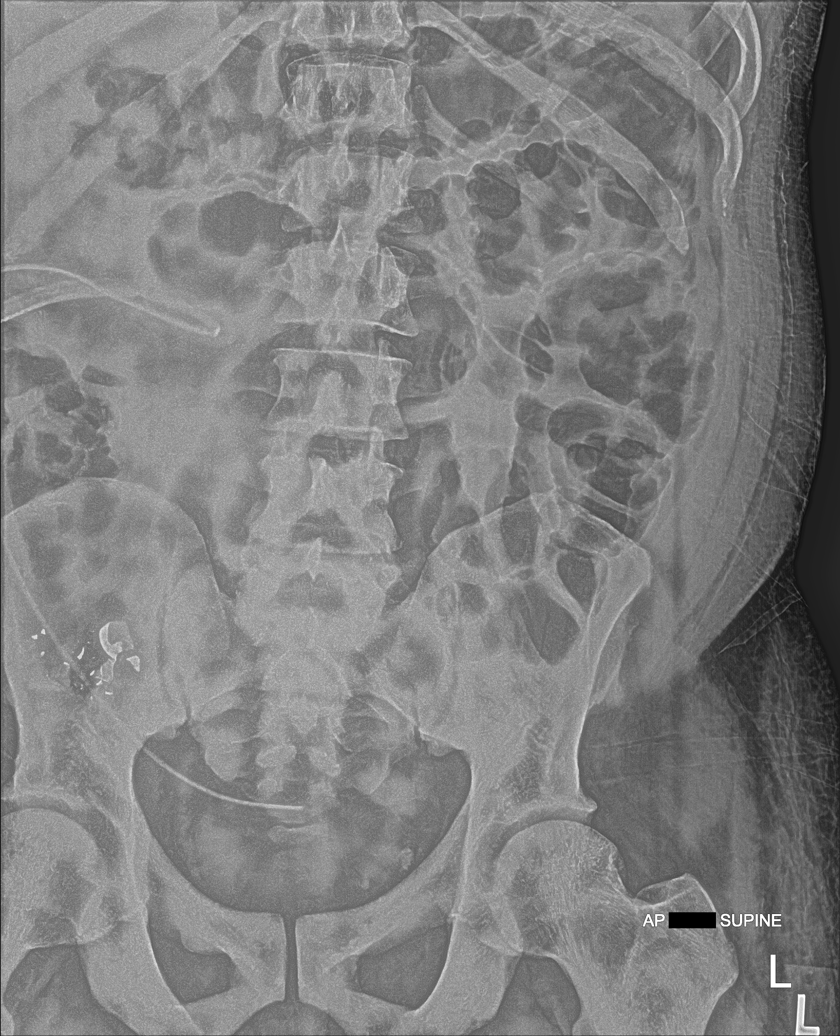
[im 5/6]
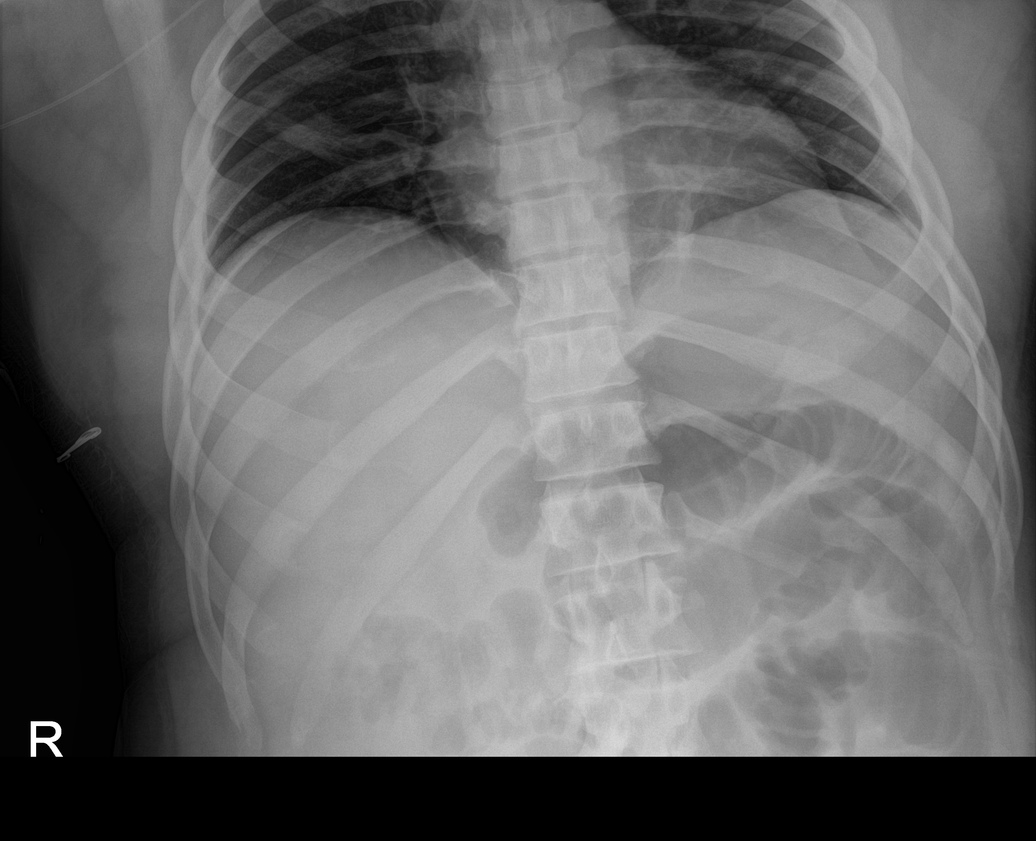
[im 6/6]
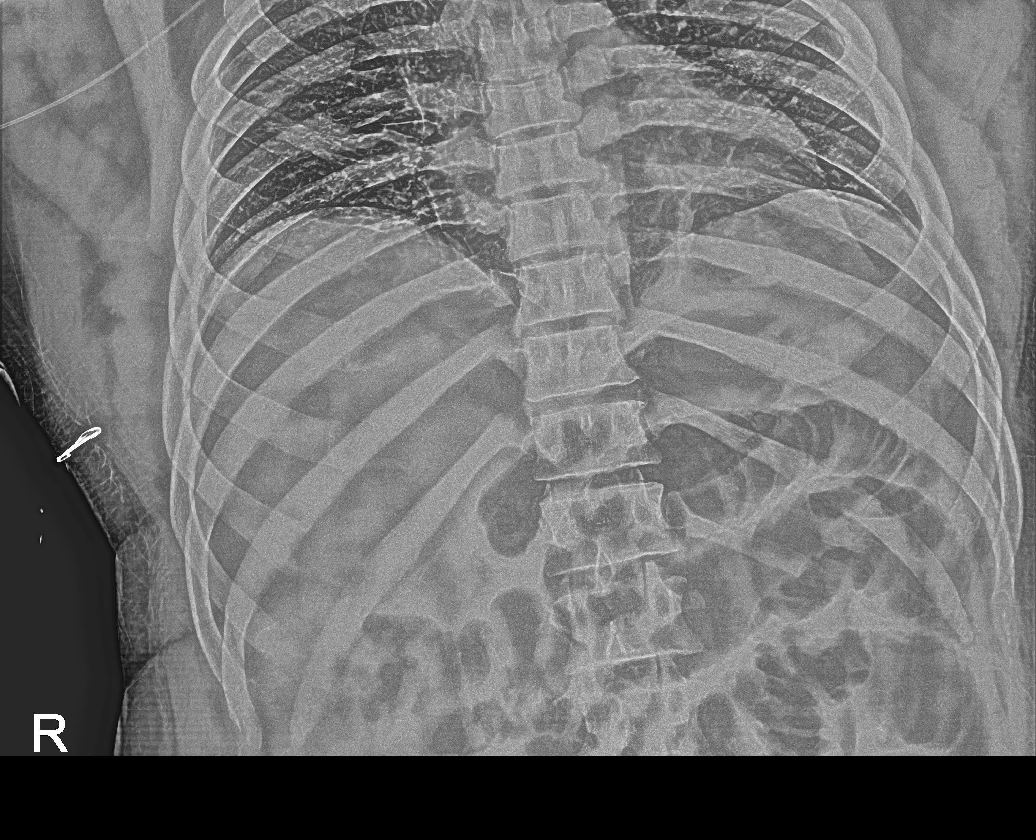

[6 of 6 positions shown; findings below may reference images not displayed]

FINDINGS: There is a drain present with the tip in the mid pelvis from
rightward approach. There are metallic foreign bodies overlying the
right lateral pelvis. There are several loops of borderline dilated
bowel without air-fluid levels. No free air. Lung bases clear.
IMPRESSION: Drain in and pelvis from rightward approach. Borderline dilated
loops of bowel, likely indicative of a degree of ileus. Bowel
obstruction less likely. No free air evident. Lung bases clear.
